# Patient Record
Sex: Female | Born: 1945 | Race: White | Hispanic: No | Smoking: Never smoker
Health system: Southern US, Community
[De-identification: ages and names within clinical notes are randomized; demographics above are authoritative.]

## PROBLEM LIST (undated history)

## (undated) DIAGNOSIS — E039 Hypothyroidism, unspecified: Secondary | ICD-10-CM

## (undated) DIAGNOSIS — Z8619 Personal history of other infectious and parasitic diseases: Secondary | ICD-10-CM

## (undated) DIAGNOSIS — F419 Anxiety disorder, unspecified: Secondary | ICD-10-CM

## (undated) DIAGNOSIS — G4700 Insomnia, unspecified: Secondary | ICD-10-CM

## (undated) DIAGNOSIS — G43909 Migraine, unspecified, not intractable, without status migrainosus: Secondary | ICD-10-CM

## (undated) DIAGNOSIS — E119 Type 2 diabetes mellitus without complications: Secondary | ICD-10-CM

## (undated) DIAGNOSIS — I10 Essential (primary) hypertension: Secondary | ICD-10-CM

## (undated) DIAGNOSIS — A809 Acute poliomyelitis, unspecified: Secondary | ICD-10-CM

## (undated) DIAGNOSIS — F32A Depression, unspecified: Secondary | ICD-10-CM

## (undated) DIAGNOSIS — N189 Chronic kidney disease, unspecified: Secondary | ICD-10-CM

## (undated) DIAGNOSIS — F329 Major depressive disorder, single episode, unspecified: Secondary | ICD-10-CM

## (undated) DIAGNOSIS — C73 Malignant neoplasm of thyroid gland: Secondary | ICD-10-CM

## (undated) DIAGNOSIS — E785 Hyperlipidemia, unspecified: Secondary | ICD-10-CM

## (undated) DIAGNOSIS — L719 Rosacea, unspecified: Secondary | ICD-10-CM

## (undated) DIAGNOSIS — Z87442 Personal history of urinary calculi: Secondary | ICD-10-CM

## (undated) HISTORY — DX: Acute poliomyelitis, unspecified: A80.9

## (undated) HISTORY — PX: BREAST SURGERY: SHX581

## (undated) HISTORY — DX: Personal history of other infectious and parasitic diseases: Z86.19

## (undated) HISTORY — DX: Chronic kidney disease, unspecified: N18.9

## (undated) HISTORY — DX: Insomnia, unspecified: G47.00

## (undated) HISTORY — DX: Hypothyroidism, unspecified: E03.9

## (undated) HISTORY — DX: Type 2 diabetes mellitus without complications: E11.9

## (undated) HISTORY — DX: Essential (primary) hypertension: I10

## (undated) HISTORY — DX: Hyperlipidemia, unspecified: E78.5

## (undated) HISTORY — DX: Personal history of urinary calculi: Z87.442

## (undated) HISTORY — DX: Anxiety disorder, unspecified: F41.9

## (undated) HISTORY — DX: Depression, unspecified: F32.A

## (undated) HISTORY — PX: APPENDECTOMY: SHX54

## (undated) HISTORY — DX: Rosacea, unspecified: L71.9

## (undated) HISTORY — DX: Migraine, unspecified, not intractable, without status migrainosus: G43.909

## (undated) HISTORY — PX: TUBAL LIGATION: SHX77

---
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
## 1995-09-30 HISTORY — PX: CHOLECYSTECTOMY: SHX55

## 2010-11-26 DIAGNOSIS — C50919 Malignant neoplasm of unspecified site of unspecified female breast: Secondary | ICD-10-CM

## 2010-11-26 HISTORY — DX: Malignant neoplasm of unspecified site of unspecified female breast: C50.919

## 2011-04-28 DIAGNOSIS — C73 Malignant neoplasm of thyroid gland: Secondary | ICD-10-CM

## 2011-04-28 HISTORY — DX: Malignant neoplasm of thyroid gland: C73

## 2011-05-05 HISTORY — PX: TOTAL THYROIDECTOMY: SHX2547

## 2011-05-29 ENCOUNTER — Other Ambulatory Visit (HOSPITAL_COMMUNITY): Payer: Self-pay | Admitting: Internal Medicine

## 2011-05-29 DIAGNOSIS — C73 Malignant neoplasm of thyroid gland: Secondary | ICD-10-CM

## 2011-06-03 ENCOUNTER — Encounter (HOSPITAL_COMMUNITY)
Admission: RE | Admit: 2011-06-03 | Discharge: 2011-06-03 | Disposition: A | Discharge status: Home / Self Care | Payer: Medicare Other | Source: Ambulatory Visit | Attending: Internal Medicine | Admitting: Internal Medicine

## 2011-06-03 ENCOUNTER — Encounter (HOSPITAL_COMMUNITY): Payer: Self-pay

## 2011-06-03 DIAGNOSIS — C73 Malignant neoplasm of thyroid gland: Secondary | ICD-10-CM | POA: Insufficient documentation

## 2011-06-03 HISTORY — DX: Malignant neoplasm of thyroid gland: C73

## 2011-06-03 MED ORDER — SODIUM IODIDE I 131 CAPSULE
108.0000 | Freq: Once | INTRAVENOUS | Status: AC | PRN
  Administered 2011-06-03: 108 via ORAL

## 2011-06-12 ENCOUNTER — Encounter (HOSPITAL_COMMUNITY)
Admission: RE | Admit: 2011-06-12 | Discharge: 2011-06-12 | Disposition: A | Discharge status: Home / Self Care | Payer: Medicare Other | Source: Ambulatory Visit | Attending: Internal Medicine | Admitting: Internal Medicine

## 2011-06-12 ENCOUNTER — Encounter (HOSPITAL_COMMUNITY): Payer: Self-pay

## 2011-06-12 DIAGNOSIS — C73 Malignant neoplasm of thyroid gland: Secondary | ICD-10-CM | POA: Insufficient documentation

## 2012-07-21 ENCOUNTER — Other Ambulatory Visit (HOSPITAL_COMMUNITY): Payer: Self-pay | Admitting: Internal Medicine

## 2012-07-21 DIAGNOSIS — C73 Malignant neoplasm of thyroid gland: Secondary | ICD-10-CM

## 2012-08-01 ENCOUNTER — Encounter (HOSPITAL_COMMUNITY): Payer: Medicare Other

## 2012-08-02 ENCOUNTER — Ambulatory Visit (HOSPITAL_COMMUNITY): Payer: Medicare Other

## 2012-08-03 ENCOUNTER — Ambulatory Visit (HOSPITAL_COMMUNITY): Payer: Medicare Other

## 2012-08-05 ENCOUNTER — Encounter (HOSPITAL_COMMUNITY): Payer: Medicare Other

## 2012-09-05 ENCOUNTER — Encounter (HOSPITAL_COMMUNITY)
Admission: RE | Admit: 2012-09-05 | Discharge: 2012-09-05 | Disposition: A | Discharge status: Home / Self Care | Payer: Medicare Other | Source: Ambulatory Visit | Attending: Internal Medicine | Admitting: Internal Medicine

## 2012-09-05 DIAGNOSIS — C73 Malignant neoplasm of thyroid gland: Secondary | ICD-10-CM | POA: Insufficient documentation

## 2012-09-05 DIAGNOSIS — E0789 Other specified disorders of thyroid: Secondary | ICD-10-CM | POA: Insufficient documentation

## 2012-09-05 MED ORDER — THYROTROPIN ALFA 1.1 MG IM SOLR
0.9000 mg | INTRAMUSCULAR | Status: AC
  Administered 2012-09-05: 0.9 mg via INTRAMUSCULAR
  Filled 2012-09-05: qty 0.9

## 2012-09-06 ENCOUNTER — Encounter (HOSPITAL_COMMUNITY): Payer: Medicare Other

## 2012-09-06 MED ORDER — THYROTROPIN ALFA 1.1 MG IM SOLR
0.9000 mg | INTRAMUSCULAR | Status: AC
  Administered 2012-09-06: 0.9 mg via INTRAMUSCULAR
  Filled 2012-09-06: qty 0.9

## 2012-09-07 ENCOUNTER — Encounter (HOSPITAL_COMMUNITY)
Admission: RE | Admit: 2012-09-07 | Discharge: 2012-09-07 | Disposition: A | Discharge status: Home / Self Care | Payer: Medicare Other | Source: Ambulatory Visit | Attending: Internal Medicine | Admitting: Internal Medicine

## 2012-09-09 ENCOUNTER — Encounter (HOSPITAL_COMMUNITY)
Admission: RE | Admit: 2012-09-09 | Discharge: 2012-09-09 | Disposition: A | Discharge status: Home / Self Care | Payer: Medicare Other | Source: Ambulatory Visit | Attending: Internal Medicine | Admitting: Internal Medicine

## 2012-09-09 MED ORDER — SODIUM IODIDE I 131 CAPSULE
4.0000 | Freq: Once | INTRAVENOUS | Status: AC | PRN
  Administered 2012-09-09: 4 via ORAL

## 2015-01-28 ENCOUNTER — Other Ambulatory Visit: Payer: Self-pay

## 2015-07-22 DIAGNOSIS — E039 Hypothyroidism, unspecified: Secondary | ICD-10-CM | POA: Insufficient documentation

## 2015-07-22 DIAGNOSIS — C73 Malignant neoplasm of thyroid gland: Secondary | ICD-10-CM | POA: Insufficient documentation

## 2015-09-17 DIAGNOSIS — C50912 Malignant neoplasm of unspecified site of left female breast: Secondary | ICD-10-CM | POA: Insufficient documentation

## 2015-11-22 DIAGNOSIS — N183 Chronic kidney disease, stage 3 unspecified: Secondary | ICD-10-CM | POA: Insufficient documentation

## 2015-11-22 DIAGNOSIS — E119 Type 2 diabetes mellitus without complications: Secondary | ICD-10-CM | POA: Insufficient documentation

## 2016-11-03 DIAGNOSIS — F419 Anxiety disorder, unspecified: Secondary | ICD-10-CM | POA: Insufficient documentation

## 2016-11-03 DIAGNOSIS — F329 Major depressive disorder, single episode, unspecified: Secondary | ICD-10-CM | POA: Insufficient documentation

## 2017-08-27 HISTORY — PX: EXTRACORPOREAL SHOCK WAVE LITHOTRIPSY: SHX1557

## 2019-10-06 ENCOUNTER — Other Ambulatory Visit: Payer: Self-pay

## 2019-10-06 ENCOUNTER — Encounter: Payer: Self-pay | Admitting: Internal Medicine

## 2019-10-06 ENCOUNTER — Ambulatory Visit (INDEPENDENT_AMBULATORY_CARE_PROVIDER_SITE_OTHER): Payer: Medicare Other | Admitting: Internal Medicine

## 2019-10-06 VITALS — BP 125/73 | HR 78 | Temp 95.6°F | Resp 18 | Ht 63.5 in | Wt 177.0 lb

## 2019-10-06 DIAGNOSIS — I10 Essential (primary) hypertension: Secondary | ICD-10-CM

## 2019-10-06 DIAGNOSIS — F325 Major depressive disorder, single episode, in full remission: Secondary | ICD-10-CM

## 2019-10-06 DIAGNOSIS — E890 Postprocedural hypothyroidism: Secondary | ICD-10-CM | POA: Diagnosis not present

## 2019-10-06 DIAGNOSIS — R634 Abnormal weight loss: Secondary | ICD-10-CM

## 2019-10-06 DIAGNOSIS — R519 Headache, unspecified: Secondary | ICD-10-CM | POA: Insufficient documentation

## 2019-10-06 DIAGNOSIS — E119 Type 2 diabetes mellitus without complications: Secondary | ICD-10-CM | POA: Diagnosis not present

## 2019-10-06 LAB — POCT GLUCOSE (DEVICE FOR HOME USE): POC Glucose: 161 mg/dl — AB (ref 70–99)

## 2019-10-06 MED ORDER — ESCITALOPRAM OXALATE 5 MG PO TABS: 5.0000 mg | ORAL_TABLET | Freq: Every day | ORAL | 2 refills | Status: DC

## 2019-10-06 NOTE — Progress Notes (Signed)
Pre visit review using our clinic review tool, if applicable. No additional management support is needed unless otherwise documented below in the visit note. 

## 2019-10-06 NOTE — Progress Notes (Signed)
Subjective:    Patient ID: Shari Drake, female    DOB: July 01, 1945, 74 y.o.   MRN: [id number]  DOS:  10/06/2019 Type of visit - description: New patient, previous doctor is retiring. We talk about all of her chronic medical problems  She reports a 1 year history of gradual weight loss, a total of 13 pounds. Also reports low blood sugars at least 2 times a month, in the 80s, associated with shaking.  History of depression, due to a number of factors she has not been feeling well emotionally think she needs a medication.  Review of Systems Denies fever chills No night sweats No unusual headaches No chest pain, difficulty breathing or palpitations No nausea, vomiting, diarrhea.  No blood in the stools. No cough No suicidal ideas.   Past Medical History:  Diagnosis Date  . Anxiety   . Breast cancer ([place]) 11/2010  . Chronic kidney disease   . Depression   . Diabetes ([place])   . History of chicken pox   . History of kidney stones   . Hyperlipidemia   . Hypertension   . Hypothyroidism   . Insomnia   . Migraine   . Polio    at 35 months old  . Rosacea   . Thyroid cancer ([place]) 2013   Family History  Problem Relation Age of Onset  . Diabetes Brother   . Heart disease Mother   . Hypertension Mother   . Heart disease Father 41       Acute MI  . Cancer Son   . Heart disease Maternal Grandmother   . Heart disease Maternal Grandfather   . Heart disease Paternal Grandmother   . Heart disease Paternal Grandfather   . Cancer Brother   . Colon cancer Neg Hx   . Breast cancer Neg Hx     Allergies as of 10/06/2019      Reactions   Iohexol    Wellbutrin [bupropion]    Iodinated Diagnostic Agents Nausea And Vomiting      Medication List       Accurate as of October 06, 2019 11:59 PM. If you have any questions, ask your nurse or doctor.        aspirin-acetaminophen-caffeine 250-250-65 MG tablet Commonly known as: EXCEDRIN MIGRAINE Take by mouth every 6 (six) hours as  needed for headache.   escitalopram 5 MG tablet Commonly known as: LEXAPRO Take 1 tablet (5 mg total) by mouth daily. Started by: Kathlene November, MD   glimepiride 2 MG tablet Commonly known as: AMARYL Take 0.5 tablets by mouth daily.   Melatonin 200 MCG Tabs Take 200 mcg by mouth daily.   metFORMIN 500 MG 24 hr tablet Commonly known as: GLUCOPHAGE-XR Take 1 tablet by mouth 2 (two) times daily with a meal.   metoprolol tartrate 25 MG tablet Commonly known as: LOPRESSOR Take 25 mg by mouth 2 (two) times daily.   Synthroid 150 MCG tablet Generic drug: levothyroxine Take 150 mcg by mouth daily.          Objective:   Physical Exam BP 125/73 (BP Location: Left Arm, Patient Position: Sitting, Cuff Size: Small)   Pulse 78   Temp (!) 95.6 F (35.3 C) (Temporal)   Resp 18   Ht 5' 3.5" (1.613 m)   Wt 177 lb (80.3 kg)   SpO2 97%   BMI 30.86 kg/m  General:   Well developed, NAD but diaphoretic appearing, BMI noted.  HEENT:  Normocephalic . Face symmetric,  atraumatic Neck: Had a thyroidectomy, no mass felt on exam Lungs:  CTA B Normal respiratory effort, no intercostal retractions, no accessory muscle use. Heart: RRR,  no murmur.  Abdomen:  Not distended, soft, non-tender. No rebound or rigidity.   Skin: Not pale. Not jaundice Lower extremities: no pretibial edema bilaterally  Neurologic:  alert & oriented X3.  Speech normal, gait appropriate for age and unassisted Psych--  Cognition and judgment appear intact.  Cooperative with normal attention span and concentration.  Behavior appropriate. No anxious or depressed appearing.     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  Check A1c. She is slightly diaphoretic today, reports she is simply a little warm, CBG 161. Hypothyroidism: On meds, check a TSH HTN: On metoprolol, BP today is very good, check a BMP CBC. Depression: On and off history of depression, last year has been very difficult due to the quarantine, the political situation (lost a friendship due to that) and isolation.  Denies suicidal ideas. Recommend counseling as a good alternative but she is not ready for that, does not think it will work. She thinks she needs a medication, in the past she tried 3 different SSRIs.  We agreed on a low-dose of Lexapro 5 mg daily (Lexapro was listed in her allergies but she denies having any problem) reassess in 2 months Weight loss: Per her only scale 13 pounds in a year, reassess on RTC, general labs today. RTC 2 months  This visit occurred during the SARS-CoV-2 public health emergency.  Safety protocols were in place, including screening questions prior to the visit, additional usage of staff PPE, and extensive cleaning of exam room while observing appropriate contact time as indicated for disinfecting solutions.

## 2019-10-06 NOTE — Patient Instructions (Addendum)
Please schedule Medicare Wellness with Glenard Haring.   Per our records you are due for an eye exam. Please contact your eye doctor to schedule an appointment. Please have them send copies of your office visit notes to [place]. Our fax number is [phone].  Decrease glimepiride to half tablet a day  Start escitalopram 5 mg: 1 tablet daily.  Watch for side effects.  Continue checking your blood sugars. If you continue to have low numbers in the 80s, let me know     GO TO THE LAB : Get the blood work     [place], [place] back for   a checkup in 2 months

## 2019-10-07 LAB — COMPREHENSIVE METABOLIC PANEL
AG Ratio: 1.7 (calc) (ref 1.0–2.5)
ALT: 16 U/L (ref 6–29)
AST: 16 U/L (ref 10–35)
Albumin: 4.4 g/dL (ref 3.6–5.1)
Alkaline phosphatase (APISO): 87 U/L (ref 37–153)
BUN: 18 mg/dL (ref 7–25)
CO2: 26 mmol/L (ref 20–32)
Calcium: 10.5 mg/dL — ABNORMAL HIGH (ref 8.6–10.4)
Chloride: 103 mmol/L (ref 98–110)
Creat: 0.84 mg/dL (ref 0.60–0.93)
Globulin: 2.6 g/dL (calc) (ref 1.9–3.7)
Glucose, Bld: 156 mg/dL — ABNORMAL HIGH (ref 65–99)
Potassium: 4.6 mmol/L (ref 3.5–5.3)
Sodium: 140 mmol/L (ref 135–146)
Total Bilirubin: 0.6 mg/dL (ref 0.2–1.2)
Total Protein: 7 g/dL (ref 6.1–8.1)

## 2019-10-07 LAB — CBC WITH DIFFERENTIAL/PLATELET
Absolute Monocytes: 622 cells/uL (ref 200–950)
Basophils Absolute: 52 cells/uL (ref 0–200)
Basophils Relative: 0.7 %
Eosinophils Absolute: 148 cells/uL (ref 15–500)
Eosinophils Relative: 2 %
HCT: 46.3 % — ABNORMAL HIGH (ref 35.0–45.0)
Hemoglobin: 15.8 g/dL — ABNORMAL HIGH (ref 11.7–15.5)
Lymphs Abs: 2139 cells/uL (ref 850–3900)
MCH: 29.9 pg (ref 27.0–33.0)
MCHC: 34.1 g/dL (ref 32.0–36.0)
MCV: 87.5 fL (ref 80.0–100.0)
MPV: 10.3 fL (ref 7.5–12.5)
Monocytes Relative: 8.4 %
Neutro Abs: 4440 cells/uL (ref 1500–7800)
Neutrophils Relative %: 60 %
Platelets: 343 10*3/uL (ref 140–400)
RBC: 5.29 10*6/uL — ABNORMAL HIGH (ref 3.80–5.10)
RDW: 12.7 % (ref 11.0–15.0)
Total Lymphocyte: 28.9 %
WBC: 7.4 10*3/uL (ref 3.8–10.8)

## 2019-10-07 LAB — TSH: TSH: 0.27 mIU/L — ABNORMAL LOW (ref 0.40–4.50)

## 2019-10-07 LAB — HEMOGLOBIN A1C
Hgb A1c MFr Bld: 5.9 % of total Hgb — ABNORMAL HIGH (ref <5.7)
Mean Plasma Glucose: 123 (calc)
eAG (mmol/L): 6.8 (calc)

## 2019-10-09 ENCOUNTER — Telehealth: Payer: Self-pay | Admitting: Internal Medicine

## 2019-10-09 MED ORDER — LEVOTHYROXINE SODIUM 125 MCG PO TABS: 125.0000 ug | ORAL_TABLET | Freq: Every day | ORAL | 0 refills | Status: DC

## 2019-10-09 NOTE — Addendum Note (Signed)
Addended byDamita Dunnings D on: 10/09/2019 07:52 AM   Modules accepted: Orders

## 2019-10-09 NOTE — Telephone Encounter (Signed)
Results faxed to Dr. Posey Pronto- instructed his office that Pt would like to discuss results.

## 2019-10-09 NOTE — Telephone Encounter (Signed)
Caller Flonnie Wierman  Call Back # [phone]  Patient states pharmacy called her states that her thyroid medication has changed . Patient states that Dr.Patel handles her thyroid medication. Patient would like [facility] to consult with Dr Posey Pronto to before changing medication [phone])   Please Advise

## 2019-10-09 NOTE — Telephone Encounter (Signed)
Please fax results to Dr. Posey Pronto. Advise patient to call Dr. Posey Pronto office and discussed results with them.

## 2019-10-13 ENCOUNTER — Telehealth: Payer: Self-pay | Admitting: Internal Medicine

## 2019-10-13 NOTE — Telephone Encounter (Signed)
Caller:Nitza Call back phone number: [phone]   Patient wants to know lab results, also if Dr. Larose Kells has consulted with Dr. Posey Pronto.  Please advise.

## 2019-10-13 NOTE — Telephone Encounter (Signed)
Spoke w/ Pt- informed of results and informed that I faxed TSH results to Dr. Posey Pronto and asked his office to call her regarding those. She hasn't received a call but will contact him. Requesting results to be mailed to her home address---will do.

## 2019-11-01 ENCOUNTER — Other Ambulatory Visit: Payer: Self-pay

## 2019-11-01 MED ORDER — ONETOUCH DELICA LANCETS 33G MISC: 12 refills | Status: DC

## 2019-11-10 ENCOUNTER — Encounter: Payer: Self-pay | Admitting: Internal Medicine

## 2019-11-16 ENCOUNTER — Other Ambulatory Visit: Payer: Self-pay

## 2019-11-16 MED ORDER — METOPROLOL TARTRATE 25 MG PO TABS: 25.0000 mg | ORAL_TABLET | Freq: Two times a day (BID) | ORAL | 1 refills | Status: DC

## 2019-12-08 ENCOUNTER — Other Ambulatory Visit: Payer: Self-pay

## 2019-12-08 ENCOUNTER — Ambulatory Visit (INDEPENDENT_AMBULATORY_CARE_PROVIDER_SITE_OTHER): Payer: Medicare Other | Admitting: Internal Medicine

## 2019-12-08 VITALS — BP 123/78 | HR 60 | Temp 98.3°F | Resp 12 | Ht 64.0 in | Wt 177.0 lb

## 2019-12-08 DIAGNOSIS — F419 Anxiety disorder, unspecified: Secondary | ICD-10-CM

## 2019-12-08 DIAGNOSIS — E119 Type 2 diabetes mellitus without complications: Secondary | ICD-10-CM | POA: Diagnosis not present

## 2019-12-08 DIAGNOSIS — F329 Major depressive disorder, single episode, unspecified: Secondary | ICD-10-CM

## 2019-12-08 DIAGNOSIS — E039 Hypothyroidism, unspecified: Secondary | ICD-10-CM

## 2019-12-08 MED ORDER — LEVOTHYROXINE SODIUM 125 MCG PO TABS: 125.0000 ug | ORAL_TABLET | Freq: Every day | ORAL | 0 refills | Status: DC

## 2019-12-08 MED ORDER — ESCITALOPRAM OXALATE 10 MG PO TABS: 10.0000 mg | ORAL_TABLET | Freq: Every day | ORAL | 3 refills | Status: DC

## 2019-12-08 NOTE — Patient Instructions (Addendum)
STOP glimepiride  Continue Metformin  Switch to the new lower dose of Synthroid  Increase escitalopram to 10 mg daily, I am sending a prescription  Continue checking your blood sugars, if they are more than 180 please let me know   GO TO THE FRONT DESK, PLEASE SCHEDULE YOUR APPOINTMENTS Come back for blood work only in 6 weeks      Come back for  a physical exam in 3 months

## 2019-12-08 NOTE — Progress Notes (Signed)
Subjective:    Patient ID: Shari Drake, female    DOB: Sep 04, 1945, 74 y.o.   MRN: [id number]  DOS:  12/08/2019 Type of visit - description: Follow-up Since the last office visit she is feeling great. She thinks that decreasing glimepiride dose may help for better Also assessed today: Thyroid disease, depression and anxiety.  Wt Readings from Last 3 Encounters:  12/08/19 177 lb (80.3 kg)  10/06/19 177 lb (80.3 kg)     Review of Systems Feels better emotionally although has  some anxiety.  No suicidal ideas  Past Medical History:  Diagnosis Date  . Anxiety   . Breast cancer ([place]) 11/2010  . Chronic kidney disease   . Depression   . Diabetes ([place])   . History of chicken pox   . History of kidney stones   . Hyperlipidemia   . Hypertension   . Hypothyroidism   . Insomnia   . Migraine   . Polio    at 75 months old  . Rosacea   . Thyroid cancer ([place]) 2013    Past Surgical History:  Procedure Laterality Date  . APPENDECTOMY    . BREAST SURGERY     masectomy- L and [place] SECTION     1970, 1973  . CHOLECYSTECTOMY  09/30/1995  . EXTRACORPOREAL SHOCK WAVE LITHOTRIPSY  08/27/2017  . TOTAL THYROIDECTOMY  05/05/2011  . TUBAL LIGATION      Allergies as of 12/08/2019      Reactions   Iohexol    Wellbutrin [bupropion]    Iodinated Diagnostic Agents Nausea And Vomiting      Medication List       Accurate as of December 08, 2019 11:59 PM. If you have any questions, ask your nurse or doctor.        STOP taking these medications   glimepiride 2 MG tablet Commonly known as: AMARYL Stopped by: Kathlene November, MD     TAKE these medications   aspirin-acetaminophen-caffeine [phone] MG tablet Commonly known as: EXCEDRIN MIGRAINE Take by mouth every 6 (six) hours as needed for headache.   escitalopram 10 MG tablet Commonly known as: LEXAPRO Take 1 tablet (10 mg total) by mouth daily. What changed:   medication strength  how much to take Changed by: Kathlene November, MD   levothyroxine 125 MCG tablet Commonly known as: SYNTHROID Take 1 tablet (125 mcg total) by mouth daily before breakfast.   Melatonin 200 MCG Tabs Take 200 mcg by mouth daily.   metFORMIN 500 MG 24 hr tablet Commonly known as: GLUCOPHAGE-XR Take 1 tablet by mouth 2 (two) times daily with a meal.   metoprolol tartrate 25 MG tablet Commonly known as: LOPRESSOR Take 1 tablet (25 mg total) by mouth 2 (two) times daily.   OneTouch Delica Lancets 93Y Misc Check blood sugars twice daily          Objective:   Physical Exam BP 123/78 (BP Location: Right Arm, Patient Position: Sitting, Cuff Size: Normal)   Pulse 60   Temp 98.3 F (36.8 C) (Oral)   Resp 12   Ht 5\' 4"  (1.626 m)   Wt 177 lb (80.3 kg)   SpO2 97%   BMI 30.38 kg/m  General:   Well developed, NAD, BMI noted. HEENT:  Normocephalic . Face symmetric, atraumatic Lower extremities: no pretibial edema bilaterally  Skin: Not pale. Not jaundice Neurologic:  alert & oriented X3.  Speech normal, gait appropriate for age and unassisted Psych--  Cognition  and judgment appear intact.  Cooperative with normal attention span and concentration.  Behavior appropriate. No anxious or depressed appearing.      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  That seems to make her feel a lot better. Plan: Stop glimepiride completely, continue Metformin, watch CBGs.  Current CBGs 120 in the morning, 140 in the afternoon.  If they get to be more than 180 she will let me know,  ?increase Metformin dose. Hypothyroidism: TSH was suppressed, has not yet switch from levothyroxine 150 mcg to 125 mcg but will do today.  Check a TSH in 6 weeks. Depression,  anxiety: Today she reports that in addition to depression she is also slightly anxious due to some family issues.  She thinks Lexapro 5 mg is doing very well but would like to increase the dose.  Increase to 10 mg reassess on RTC RTC labs 6 weeks RTC CPX 3 months  This visit occurred during the SARS-CoV-2 public health emergency.  Safety protocols were in place, including screening questions prior to the visit, additional usage of staff PPE, and extensive cleaning of exam room while observing appropriate contact time as indicated for disinfecting solutions.

## 2019-12-10 ENCOUNTER — Encounter: Payer: Self-pay | Admitting: Internal Medicine

## 2019-12-10 DIAGNOSIS — Z09 Encounter for follow-up examination after completed treatment for conditions other than malignant neoplasm: Secondary | ICD-10-CM | POA: Insufficient documentation

## 2019-12-10 NOTE — Assessment & Plan Note (Signed)
DM: Since her last office visit, glimepiride was decreased to half tablet due to A1c of 5.9.  That seems to make her feel a lot better. Plan: Stop glimepiride completely, continue Metformin, watch CBGs.  Current CBGs 120 in the morning, 140 in the afternoon.  If they get to be more than 180 she will let me know,  ?increase Metformin dose. Hypothyroidism: TSH was suppressed, has not yet switch from levothyroxine 150 mcg to 125 mcg but will do today.  Check a TSH in 6 weeks. Depression, anxiety: Today she reports that in addition to depression she is also slightly anxious due to some family issues.  She thinks Lexapro 5 mg is doing very well but would like to increase the dose.  Increase to 10 mg reassess on RTC RTC labs 6 weeks RTC CPX 3 months

## 2019-12-31 ENCOUNTER — Other Ambulatory Visit: Payer: Self-pay | Admitting: Internal Medicine

## 2020-01-02 MED ORDER — ESCITALOPRAM OXALATE 10 MG PO TABS: 10.0000 mg | ORAL_TABLET | Freq: Every day | ORAL | 3 refills | Status: DC

## 2020-01-18 ENCOUNTER — Other Ambulatory Visit: Payer: Medicare Other

## 2020-02-08 ENCOUNTER — Other Ambulatory Visit: Payer: Self-pay

## 2020-02-08 MED ORDER — METFORMIN HCL ER 500 MG PO TB24: 500.0000 mg | ORAL_TABLET | Freq: Two times a day (BID) | ORAL | 0 refills | Status: DC

## 2020-02-12 ENCOUNTER — Other Ambulatory Visit: Payer: Self-pay | Admitting: Internal Medicine

## 2020-03-02 ENCOUNTER — Other Ambulatory Visit: Payer: Self-pay | Admitting: Internal Medicine

## 2020-03-08 ENCOUNTER — Encounter: Payer: Self-pay | Admitting: Internal Medicine

## 2020-03-15 ENCOUNTER — Ambulatory Visit: Payer: Medicare Other | Admitting: Internal Medicine

## 2020-03-29 ENCOUNTER — Other Ambulatory Visit: Payer: Self-pay

## 2020-03-29 ENCOUNTER — Ambulatory Visit (INDEPENDENT_AMBULATORY_CARE_PROVIDER_SITE_OTHER): Payer: Medicare Other | Admitting: Internal Medicine

## 2020-03-29 VITALS — BP 154/70 | HR 58 | Temp 97.7°F | Ht 64.0 in | Wt 174.8 lb

## 2020-03-29 DIAGNOSIS — E039 Hypothyroidism, unspecified: Secondary | ICD-10-CM | POA: Diagnosis not present

## 2020-03-29 DIAGNOSIS — E119 Type 2 diabetes mellitus without complications: Secondary | ICD-10-CM

## 2020-03-29 DIAGNOSIS — Z7185 Encounter for immunization safety counseling: Secondary | ICD-10-CM | POA: Diagnosis not present

## 2020-03-29 DIAGNOSIS — Z23 Encounter for immunization: Secondary | ICD-10-CM

## 2020-03-29 NOTE — Progress Notes (Signed)
Subjective:    Patient ID: Shari Drake, female    DOB: 1946-01-18, 74 y.o.   MRN: [id number]  DOS:  03/29/2020 Type of visit - description: Follow-up Since the last office visit she is actually feeling great. Her diet is healthy, she is trying to walk every other day. Emotionally is doing great.   Review of Systems See above   Past Medical History:  Diagnosis Date  . Anxiety   . Breast cancer ([place]) 11/2010  . Chronic kidney disease   . Depression   . Diabetes ([place])   . History of chicken pox   . History of kidney stones   . Hyperlipidemia   . Hypertension   . Hypothyroidism   . Insomnia   . Migraine   . Polio    at 54 months old  . Rosacea   . Thyroid cancer ([place]) 2013    Past Surgical History:  Procedure Laterality Date  . APPENDECTOMY    . BREAST SURGERY     masectomy- L and [place] SECTION     1970, 1973  . CHOLECYSTECTOMY  09/30/1995  . EXTRACORPOREAL SHOCK WAVE LITHOTRIPSY  08/27/2017  . TOTAL THYROIDECTOMY  05/05/2011  . TUBAL LIGATION      Allergies as of 03/29/2020      Reactions   Iohexol    Wellbutrin [bupropion]    Iodinated Diagnostic Agents Nausea And Vomiting      Medication List       Accurate as of March 29, 2020 11:59 PM. If you have any questions, ask your nurse or doctor.        aspirin-acetaminophen-caffeine 250-250-65 MG tablet Commonly known as: EXCEDRIN MIGRAINE Take by mouth every 6 (six) hours as needed for headache.   escitalopram 10 MG tablet Commonly known as: LEXAPRO Take 1 tablet (10 mg total) by mouth daily.   levothyroxine 125 MCG tablet Commonly known as: Synthroid Take 1 tablet (125 mcg total) by mouth daily before breakfast.   Melatonin 200 MCG Tabs Take 200 mcg by mouth daily.   metFORMIN 500 MG 24 hr tablet Commonly known as: GLUCOPHAGE-XR Take 1 tablet (500 mg total) by mouth 2 (two) times daily with a meal.   metoprolol tartrate 25 MG tablet Commonly known as: LOPRESSOR Take 1 tablet (25  mg total) by mouth 2 (two) times daily.   OneTouch Delica Lancets 91Y Misc Check blood sugars twice daily          Objective:   Physical Exam BP (!) 154/70 (BP Location: Right Arm, Patient Position: Sitting, Cuff Size: Large)   Pulse (!) 58   Temp 97.7 F (36.5 C) (Oral)   Ht 5\' 4"  (1.626 m)   Wt 174 lb 12.8 oz (79.3 kg)   SpO2 99%   BMI 30.00 kg/m  General:   Well developed, NAD, BMI noted. HEENT:  Normocephalic . Face symmetric, atraumatic Lungs:  CTA B Normal respiratory effort, no intercostal retractions, no accessory muscle use. Heart: RRR,  no murmur.  Lower extremities: no pretibial edema bilaterally  Skin: Not pale. Not jaundice Neurologic:  alert & oriented X3.  Speech normal, gait appropriate for age and unassisted Psych--  Cognition and judgment appear intact.  Cooperative with normal attention span and concentration.  Behavior appropriate. No anxious or depressed appearing.      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  In the afternoon 150, 160.  Check A1c, CMP, consider adjust Metformin if needed HTN: On metoprolol, BP is very good. Depression, anxiety: We increase Lexapro the last time, doing great.  RF as needed Hypothyroidism: Taking Synthroid 125 mcg, check a TSH. Preventive care reviewed, to have a CPX at the next opportunity: Tdap recommended PNM 13:  2019 PMN 23: 2012 Had COVID vaccine x3 Flu shot toda CCS: Never had a colonoscopy, did have stool tests before (-)  female care: Has not seen a gynecologist in years RTC 4 months CPX   This visit occurred during the SARS-CoV-2 public health emergency.  Safety protocols were in place, including screening questions prior to the visit, additional usage of staff  PPE, and extensive cleaning of exam room while observing appropriate contact time as indicated for disinfecting solutions.

## 2020-03-29 NOTE — Patient Instructions (Addendum)
   When you are ready, please proceed with Tdap at your pharmacy, it is a tetanus shot that also contains protection against the whooping cough.  GO TO THE LAB : Get the blood work     GO TO THE FRONT DESK, [place] Come back for a physical exam in 4 months

## 2020-03-30 LAB — COMPREHENSIVE METABOLIC PANEL
AG Ratio: 1.8 (calc) (ref 1.0–2.5)
ALT: 11 U/L (ref 6–29)
AST: 17 U/L (ref 10–35)
Albumin: 4.1 g/dL (ref 3.6–5.1)
Alkaline phosphatase (APISO): 66 U/L (ref 37–153)
BUN: 17 mg/dL (ref 7–25)
CO2: 29 mmol/L (ref 20–32)
Calcium: 9.7 mg/dL (ref 8.6–10.4)
Chloride: 102 mmol/L (ref 98–110)
Creat: 0.78 mg/dL (ref 0.60–0.93)
Globulin: 2.3 g/dL (calc) (ref 1.9–3.7)
Glucose, Bld: 170 mg/dL — ABNORMAL HIGH (ref 65–99)
Potassium: 3.9 mmol/L (ref 3.5–5.3)
Sodium: 140 mmol/L (ref 135–146)
Total Bilirubin: 0.7 mg/dL (ref 0.2–1.2)
Total Protein: 6.4 g/dL (ref 6.1–8.1)

## 2020-03-30 LAB — LIPID PANEL
Cholesterol: 128 mg/dL (ref <200)
HDL: 37 mg/dL — ABNORMAL LOW (ref >=50)
LDL Cholesterol (Calc): 65 mg/dL (calc)
Non-HDL Cholesterol (Calc): 91 mg/dL (calc) (ref <130)
Total CHOL/HDL Ratio: 3.5 (calc) (ref <5.0)
Triglycerides: 182 mg/dL — ABNORMAL HIGH (ref <150)

## 2020-03-30 LAB — HEMOGLOBIN A1C
Hgb A1c MFr Bld: 6.6 % of total Hgb — ABNORMAL HIGH (ref <5.7)
Mean Plasma Glucose: 143 (calc)
eAG (mmol/L): 7.9 (calc)

## 2020-03-30 LAB — TSH: TSH: 1.2 mIU/L (ref 0.40–4.50)

## 2020-03-31 NOTE — Assessment & Plan Note (Signed)
DM: On Metformin, CBGs in the morning 130, 150.  In the afternoon 150, 160.  Check A1c, CMP, consider adjust Metformin if needed HTN: On metoprolol, BP is very good. Depression, anxiety: We increase Lexapro the last time, doing great.  RF as needed Hypothyroidism: Taking Synthroid 125 mcg, check a TSH. Preventive care reviewed, to have a CPX at the next opportunity: Tdap recommended PNM 13:  2019 PMN 23: 2012 Had COVID vaccine x3 Flu shot toda CCS: Never had a colonoscopy, did have stool tests before (-)  female care: Has not seen a gynecologist in years RTC 4 months CPX

## 2020-04-08 ENCOUNTER — Telehealth: Payer: Self-pay | Admitting: Internal Medicine

## 2020-04-08 NOTE — Telephone Encounter (Signed)
Results mailed 

## 2020-04-08 NOTE — Telephone Encounter (Signed)
Patient is requesting lab results sent to home address.

## 2020-05-01 ENCOUNTER — Other Ambulatory Visit: Payer: Self-pay | Admitting: Internal Medicine

## 2020-06-11 ENCOUNTER — Encounter: Payer: Self-pay | Admitting: Internal Medicine

## 2020-06-11 ENCOUNTER — Telehealth (INDEPENDENT_AMBULATORY_CARE_PROVIDER_SITE_OTHER): Payer: Medicare Other | Admitting: Internal Medicine

## 2020-06-11 ENCOUNTER — Other Ambulatory Visit: Payer: Self-pay

## 2020-06-11 VITALS — HR 60 | Temp 98.2°F | Ht 64.0 in | Wt 171.0 lb

## 2020-06-11 DIAGNOSIS — N760 Acute vaginitis: Secondary | ICD-10-CM

## 2020-06-11 DIAGNOSIS — M545 Low back pain, unspecified: Secondary | ICD-10-CM | POA: Diagnosis not present

## 2020-06-11 MED ORDER — FLUCONAZOLE 150 MG PO TABS: 150.0000 mg | ORAL_TABLET | Freq: Every day | ORAL | 0 refills | Status: DC

## 2020-06-11 NOTE — Progress Notes (Signed)
Subjective:    Patient ID: Shari Drake, female    DOB: 07/31/1945, 75 y.o.   MRN: [id number]  DOS:  06/11/2020 Type of visit - description: Virtual Visit via Telephone    I connected with above mentioned patient  by telephone and verified that I am speaking with the correct person using two identifiers.  THIS ENCOUNTER IS A VIRTUAL VISIT DUE TO COVID-19 - PATIENT WAS NOT SEEN IN THE OFFICE. PATIENT HAS CONSENTED TO VIRTUAL VISIT / TELEMEDICINE VISIT   Location of patient: home  Location of provider: office  Persons participating in the virtual visit: patient, provider   I discussed the limitations, risks, security and privacy concerns of performing an evaluation and management service by telephone and the availability of in person appointments. I also discussed with the patient that there may be a patient responsible charge related to this service. The patient expressed understanding and agreed to proceed.  Acute Symptoms a started 3 weeks ago: Low back pain, feeling bloated all the time, vulvar itching and some vulvar  swelling. She felt all of the above symptoms were due to vaginitis, has used OTC creams x2 without much help.  Denies fever chills No vaginal discharge or bleeding No nausea, vomiting, heartburn. No constipation or diarrhea, BMs are normal No dysuria, gross hematuria or difficulty urinating     Review of Systems See above   Past Medical History:  Diagnosis Date  . Anxiety   . Breast cancer ([place]) 11/2010  . Chronic kidney disease   . Depression   . Diabetes ([place])   . History of chicken pox   . History of kidney stones   . Hyperlipidemia   . Hypertension   . Hypothyroidism   . Insomnia   . Migraine   . Polio    at 27 months old  . Rosacea   . Thyroid cancer ([place]) 2013    Past Surgical History:  Procedure Laterality Date  . APPENDECTOMY    . BREAST SURGERY     masectomy- L and [place] SECTION     1970, 1973  . CHOLECYSTECTOMY   09/30/1995  . EXTRACORPOREAL SHOCK WAVE LITHOTRIPSY  08/27/2017  . TOTAL THYROIDECTOMY  05/05/2011  . TUBAL LIGATION      Allergies as of 06/11/2020      Reactions   Iohexol    Wellbutrin [bupropion]    Iodinated Diagnostic Agents Nausea And Vomiting      Medication List       Accurate as of June 11, 2020  2:23 PM. If you have any questions, ask your nurse or doctor.        aspirin-acetaminophen-caffeine 250-250-65 MG tablet Commonly known as: EXCEDRIN MIGRAINE Take by mouth every 6 (six) hours as needed for headache.   escitalopram 10 MG tablet Commonly known as: LEXAPRO Take 1 tablet (10 mg total) by mouth daily.   levothyroxine 125 MCG tablet Commonly known as: Synthroid Take 1 tablet (125 mcg total) by mouth daily before breakfast.   Melatonin 200 MCG Tabs Take 200 mcg by mouth daily.   metFORMIN 500 MG 24 hr tablet Commonly known as: GLUCOPHAGE-XR Take 1 tablet (500 mg total) by mouth 2 (two) times daily with a meal.   metoprolol tartrate 25 MG tablet Commonly known as: LOPRESSOR Take 1 tablet (25 mg total) by mouth 2 (two) times daily.   OneTouch Delica Lancets 18H Misc Check blood sugars twice daily  Objective:   Physical Exam Pulse 60   Temp 98.2 F (36.8 C)   Ht 5\' 4"  (1.626 m)   Wt 171 lb (77.6 kg)   SpO2 96%   BMI 29.35 kg/m  This is a telephone visit, she sounded alert oriented x3, in no distress.    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 Back pain, feeling bloated: She believes these symptoms are due to vaginitis, advised patient I do not think so, we agreed to try Diflucan and if she is no better she will come back within the next  few days for further evaluation   I discussed the assessment and treatment plan with the patient. The patient was provided an opportunity to ask questions and all were answered. The patient agreed with the plan and demonstrated an understanding of the instructions.   The patient was advised to call back or seek an in-person evaluation if the symptoms worsen or if the condition fails to improve as anticipated.  I provided 22 minutes of non-face-to-face time during this encounter.  Kathlene November, MD

## 2020-06-12 NOTE — Assessment & Plan Note (Signed)
Vaginitis: Reports vulvar itching and mild swelling not responding to OTC ointments x2, trial with Diflucan. Back pain, feeling bloated: She believes these symptoms are due to vaginitis, advised patient I do not think so, we agreed to try Diflucan and if she is no better she will come back within the next few days for further evaluation

## 2020-06-29 ENCOUNTER — Other Ambulatory Visit: Payer: Self-pay | Admitting: Internal Medicine

## 2020-07-29 ENCOUNTER — Other Ambulatory Visit: Payer: Self-pay | Admitting: Internal Medicine

## 2020-08-02 ENCOUNTER — Ambulatory Visit (INDEPENDENT_AMBULATORY_CARE_PROVIDER_SITE_OTHER): Payer: Medicare Other | Admitting: Internal Medicine

## 2020-08-02 ENCOUNTER — Other Ambulatory Visit: Payer: Self-pay

## 2020-08-02 ENCOUNTER — Telehealth: Payer: Self-pay | Admitting: Hematology and Oncology

## 2020-08-02 ENCOUNTER — Encounter: Payer: Self-pay | Admitting: Internal Medicine

## 2020-08-02 VITALS — BP 160/81 | HR 62 | Temp 97.7°F | Ht 64.0 in | Wt 174.0 lb

## 2020-08-02 DIAGNOSIS — N183 Chronic kidney disease, stage 3 unspecified: Secondary | ICD-10-CM | POA: Diagnosis not present

## 2020-08-02 DIAGNOSIS — E119 Type 2 diabetes mellitus without complications: Secondary | ICD-10-CM

## 2020-08-02 DIAGNOSIS — Z23 Encounter for immunization: Secondary | ICD-10-CM

## 2020-08-02 DIAGNOSIS — Z1211 Encounter for screening for malignant neoplasm of colon: Secondary | ICD-10-CM

## 2020-08-02 DIAGNOSIS — Z0001 Encounter for general adult medical examination with abnormal findings: Secondary | ICD-10-CM

## 2020-08-02 DIAGNOSIS — Z0000 Encounter for general adult medical examination without abnormal findings: Secondary | ICD-10-CM

## 2020-08-02 DIAGNOSIS — E039 Hypothyroidism, unspecified: Secondary | ICD-10-CM

## 2020-08-02 DIAGNOSIS — Z853 Personal history of malignant neoplasm of breast: Secondary | ICD-10-CM

## 2020-08-02 DIAGNOSIS — I10 Essential (primary) hypertension: Secondary | ICD-10-CM

## 2020-08-02 DIAGNOSIS — R222 Localized swelling, mass and lump, trunk: Secondary | ICD-10-CM

## 2020-08-02 DIAGNOSIS — Z1159 Encounter for screening for other viral diseases: Secondary | ICD-10-CM

## 2020-08-02 DIAGNOSIS — Z780 Asymptomatic menopausal state: Secondary | ICD-10-CM | POA: Diagnosis not present

## 2020-08-02 MED ORDER — LOSARTAN POTASSIUM 50 MG PO TABS: 50.0000 mg | ORAL_TABLET | Freq: Every day | ORAL | 0 refills | Status: DC

## 2020-08-02 NOTE — Patient Instructions (Addendum)
Per our records you are due for an eye exam. Please contact your eye doctor to schedule an appointment. Please have them send copies of your office visit notes to [place]. Our fax number is [phone].  We are adding losartan to get better BP control, take one every night Check your blood pressure 3 times a week BP GOAL is between 110/65 and  135/85.   GO TO THE LAB : Get the blood work     [place], [place] back for blood work in 2 weeks  Come back for a checkup in 6 months  Stop by the first floor and schedule a bone density test  Consider getting a Tdap at your pharmacy     "Living will", "[place] of attorney": Advanced care planning  (If you already have a living will or healthcare power of attorney, please bring the copy to be scanned in your chart.)  Advance care planning is a process that supports adults in  understanding and sharing their preferences regarding future medical care.   The patient's preferences are recorded in documents called Advance Directives.    Advanced directives are completed (and can be modified at any time) while the patient is in full mental capacity.   The documentation should be available at all times to the patient, the family and the healthcare providers.  Bring in a copy to be scanned in your chart is an excellent idea and is recommended   This legal documents direct treatment decision making and/or appoint a surrogate to make the decision if the patient is not capable to do so.    Advance directives can be documented in many types of formats,  documents have names such as:  Lliving will  Durable power of attorney for healthcare (healthcare proxy or healthcare power of attorney)  Combined directives  Physician orders for life-sustaining treatment    More information at:  [email or web address]

## 2020-08-02 NOTE — Telephone Encounter (Signed)
Created in error

## 2020-08-02 NOTE — Progress Notes (Signed)
Subjective:    Patient ID: Shari Drake, female    DOB: [birth year], 75 y.o.   MRN: [id number]  DOS:  08/02/2020   Here for CPX  Since the last office visit she is doing well. Did noted a couple of places at the right mastectomy scar and she is extremely concerned about the issue given her history of cancer.    BP Readings from Last 3 Encounters:  08/02/20 (!) 160/81  03/29/20 (!) 154/70  12/08/19 123/78      Review of Systems  Other than above, a 14 point review of systems is negative      Past Medical History:  Diagnosis Date  . Anxiety   . Breast cancer ([place]) 11/2010  . Chronic kidney disease   . Depression   . Diabetes ([place])   . History of chicken pox   . History of kidney stones   . Hyperlipidemia   . Hypertension   . Hypothyroidism   . Insomnia   . Migraine   . Polio    at 33 months old  . Rosacea   . Thyroid cancer ([place]) 2013    Past Surgical History:  Procedure Laterality Date  . APPENDECTOMY    . BREAST SURGERY     masectomy- L and [place] SECTION     1970, 1973  . CHOLECYSTECTOMY  09/30/1995  . EXTRACORPOREAL SHOCK WAVE LITHOTRIPSY  08/27/2017  . TOTAL THYROIDECTOMY  05/05/2011  . TUBAL LIGATION      Allergies as of 08/02/2020      Reactions   Iohexol    Wellbutrin [bupropion]    Iodinated Diagnostic Agents Nausea And Vomiting      Medication List       Accurate as of August 02, 2020  2:07 PM. If you have any questions, ask your nurse or doctor.        aspirin-acetaminophen-caffeine 250-250-65 MG tablet Commonly known as: EXCEDRIN MIGRAINE Take by mouth every 6 (six) hours as needed for headache.   escitalopram 10 MG tablet Commonly known as: LEXAPRO Take 1 tablet (10 mg total) by mouth daily.   fluconazole 150 MG tablet Commonly known as: DIFLUCAN Take 1 tablet (150 mg total) by mouth daily.   levothyroxine 125 MCG tablet Commonly known as: Synthroid Take 1 tablet (125 mcg total) by mouth daily before breakfast.    Melatonin 200 MCG Tabs Take 200 mcg by mouth daily.   metFORMIN 500 MG 24 hr tablet Commonly known as: GLUCOPHAGE-XR Take 1 tablet (500 mg total) by mouth 2 (two) times daily with a meal.   metoprolol tartrate 25 MG tablet Commonly known as: LOPRESSOR Take 1 tablet (25 mg total) by mouth 2 (two) times daily.   OneTouch Delica Lancets 08M Misc Check blood sugars twice daily          Objective:   Physical Exam BP (!) 160/81 (BP Location: Right Arm, Patient Position: Sitting, Cuff Size: Large)   Pulse 62   Temp 97.7 F (36.5 C) (Temporal)   Ht 5\' 4"  (1.626 m)   Wt 174 lb (78.9 kg)   SpO2 98%   BMI 29.87 kg/m  General: Well developed, NAD, BMI noted Neck: History of total thyroidectomy, neck is free of mass or lymphadenopathies HEENT:  Normocephalic . Face symmetric, atraumatic Lungs:  CTA B Normal respiratory effort, no intercostal retractions, no accessory muscle use. Heart: RRR,  no murmur. Chest Wall: Status post mastectomies bilaterally. Left the scar is well-healed Right eschar:  It is well-healed, she point to a couple of small lumps, she does have 2 pellet-like lumps that are easily movable. I did feel a small lymph node at the right axillary area (mobile, not attached to deeper structures, less than 1 cm). Abdomen:  Not distended, soft, non-tender. No rebound or rigidity.   Lower extremities: no pretibial edema bilaterally  Skin: Exposed areas without rash. Not pale. Not jaundice Neurologic:  alert & oriented X3.  Speech normal, gait appropriate for age and unassisted Strength symmetric and appropriate for age.  Psych: Cognition and judgment appear intact.  Cooperative with normal attention span and concentration.  Behavior appropriate. No anxious or depressed appearing.     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 HTN: BP has been elevated twice, no ambulatory BPs.  Continue metoprolol, add losartan 50, BMP today and in 2 weeks.  Recommend to start checking BPs. History of thyroid cancer: Physical exam today is benign. History of breast cancer: See CPX Hypothyroidism: Checking labs RTC labs in 2 weeks RTC visit 6 months       In addition to CPX we address her chronic medical problems and a new problem (abnormal surgical scar on self palpation).   This visit occurred during the SARS-CoV-2 public health emergency.  Safety protocols were in place, including screening questions prior to the visit, additional usage of staff PPE, and extensive cleaning of exam room while observing appropriate contact time as indicated for disinfecting solutions.

## 2020-08-03 ENCOUNTER — Encounter: Payer: Self-pay | Admitting: Internal Medicine

## 2020-08-03 DIAGNOSIS — Z0000 Encounter for general adult medical examination without abnormal findings: Secondary | ICD-10-CM | POA: Insufficient documentation

## 2020-08-03 NOTE — Assessment & Plan Note (Signed)
Here for CPX DM: On Metformin, checking A1c. HTN: BP has been elevated twice, no ambulatory BPs.  Continue metoprolol, add losartan 50, BMP today and in 2 weeks.  Recommend to start checking BPs. History of thyroid cancer: Physical exam today is benign. History of breast cancer: See CPX Hypothyroidism: Checking labs RTC labs in 2 weeks RTC visit 6 months

## 2020-08-03 NOTE — Assessment & Plan Note (Signed)
Tdap: Again rec to proceed at the pharmacy [facility] 13:  2019; PMN 23: 2012, booster today shingrix recommend ($$) Had COVID vaccine x3 Female care: Cervical ca screening: never had an abnormal PAP, no sxs, we agreed no further eval H/o Breast CA, s/p B mastectomy.   Has noted a couple of lumps at the right eschar, exam confirms presence of two pellet-like lumps and a right axillary lymph node.  They do not seem to be malignant on clinical grounds but here she remains concerned, we agreed she will see surgery to confirm everything is okay. Dexa: last ~ 2016, no on Ca and Vit D, rec to start vit D, check DEXA CCS: Never had a colonoscopy, 3 options d/w pt: cologuard, rec to check coverage w/ insurance  Labs: BMP, CBC, A1c, TSH, vitamin D, hep C Advance care planning d/w pt

## 2020-08-05 ENCOUNTER — Telehealth: Payer: Self-pay | Admitting: Internal Medicine

## 2020-08-05 NOTE — Telephone Encounter (Signed)
Patient would like lab done on 08/02/20 mailed to her

## 2020-08-05 NOTE — Telephone Encounter (Signed)
Will do as soon as Dr. Larose Kells reviews them. -Jma

## 2020-08-06 LAB — CBC WITH DIFFERENTIAL/PLATELET
Absolute Monocytes: 599 cells/uL (ref 200–950)
Basophils Absolute: 52 cells/uL (ref 0–200)
Basophils Relative: 0.7 %
Eosinophils Absolute: 111 cells/uL (ref 15–500)
Eosinophils Relative: 1.5 %
HCT: 46.5 % — ABNORMAL HIGH (ref 35.0–45.0)
Hemoglobin: 15.9 g/dL — ABNORMAL HIGH (ref 11.7–15.5)
Lymphs Abs: 2361 cells/uL (ref 850–3900)
MCH: 29.8 pg (ref 27.0–33.0)
MCHC: 34.2 g/dL (ref 32.0–36.0)
MCV: 87.2 fL (ref 80.0–100.0)
MPV: 9.6 fL (ref 7.5–12.5)
Monocytes Relative: 8.1 %
Neutro Abs: 4277 cells/uL (ref 1500–7800)
Neutrophils Relative %: 57.8 %
Platelets: 358 10*3/uL (ref 140–400)
RBC: 5.33 10*6/uL — ABNORMAL HIGH (ref 3.80–5.10)
RDW: 13 % (ref 11.0–15.0)
Total Lymphocyte: 31.9 %
WBC: 7.4 10*3/uL (ref 3.8–10.8)

## 2020-08-06 LAB — VITAMIN D 25 HYDROXY (VIT D DEFICIENCY, FRACTURES): Vit D, 25-Hydroxy: 12 ng/mL — ABNORMAL LOW (ref 30–100)

## 2020-08-06 LAB — HEMOGLOBIN A1C
Hgb A1c MFr Bld: 6.5 % of total Hgb — ABNORMAL HIGH (ref <5.7)
Mean Plasma Glucose: 140 mg/dL
eAG (mmol/L): 7.7 mmol/L

## 2020-08-06 LAB — BASIC METABOLIC PANEL
BUN: 15 mg/dL (ref 7–25)
CO2: 24 mmol/L (ref 20–32)
Calcium: 10.1 mg/dL (ref 8.6–10.4)
Chloride: 104 mmol/L (ref 98–110)
Creat: 0.8 mg/dL (ref 0.60–0.93)
Glucose, Bld: 137 mg/dL — ABNORMAL HIGH (ref 65–99)
Potassium: 5.1 mmol/L (ref 3.5–5.3)
Sodium: 143 mmol/L (ref 135–146)

## 2020-08-06 LAB — HEPATITIS C ANTIBODY
Hepatitis C Ab: NONREACTIVE
SIGNAL TO CUT-OFF: 0.01 (ref <1.00)

## 2020-08-06 LAB — TSH: TSH: 1.53 mIU/L (ref 0.40–4.50)

## 2020-08-08 ENCOUNTER — Other Ambulatory Visit: Payer: Self-pay

## 2020-08-08 MED ORDER — VITAMIN D (ERGOCALCIFEROL) 1.25 MG (50000 UNIT) PO CAPS: 50000.0000 [IU] | ORAL_CAPSULE | ORAL | 0 refills | Status: DC

## 2020-08-08 NOTE — Telephone Encounter (Signed)
Results mailed 

## 2020-08-08 NOTE — Addendum Note (Signed)
Addended byDamita Dunnings D on: 08/08/2020 02:12 PM   Modules accepted: Orders

## 2020-08-14 LAB — COLOGUARD

## 2020-08-16 ENCOUNTER — Other Ambulatory Visit: Payer: Self-pay

## 2020-08-16 ENCOUNTER — Other Ambulatory Visit: Payer: Medicare Other

## 2020-08-16 MED ORDER — ONETOUCH VERIO VI STRP: ORAL_STRIP | 12 refills | Status: DC

## 2020-08-22 DIAGNOSIS — C50212 Malignant neoplasm of upper-inner quadrant of left female breast: Secondary | ICD-10-CM | POA: Diagnosis not present

## 2020-08-22 DIAGNOSIS — R222 Localized swelling, mass and lump, trunk: Secondary | ICD-10-CM | POA: Diagnosis not present

## 2020-08-22 DIAGNOSIS — Z9013 Acquired absence of bilateral breasts and nipples: Secondary | ICD-10-CM | POA: Diagnosis not present

## 2020-08-22 DIAGNOSIS — Z170 Estrogen receptor positive status [ER+]: Secondary | ICD-10-CM | POA: Diagnosis not present

## 2020-08-22 DIAGNOSIS — Z853 Personal history of malignant neoplasm of breast: Secondary | ICD-10-CM | POA: Diagnosis not present

## 2020-08-22 DIAGNOSIS — Z79811 Long term (current) use of aromatase inhibitors: Secondary | ICD-10-CM | POA: Diagnosis not present

## 2020-08-26 ENCOUNTER — Other Ambulatory Visit: Payer: Self-pay | Admitting: Internal Medicine

## 2020-09-22 ENCOUNTER — Other Ambulatory Visit: Payer: Self-pay | Admitting: Internal Medicine

## 2020-09-24 ENCOUNTER — Telehealth: Payer: Self-pay | Admitting: Internal Medicine

## 2020-09-24 NOTE — Telephone Encounter (Signed)
Please advise 

## 2020-09-24 NOTE — Telephone Encounter (Signed)
Pt states that Dr. Larose Kells informed her that if her blood sugar was over 200 at night to take two metformin. Pt states that the pharmacy informed her to let Dr. Larose Kells know ensure that he puts the directions on the rx that its ok to take 2 at night. Pt states she needs a refill now.

## 2020-09-24 NOTE — Telephone Encounter (Signed)
As far as I know, she takes metformin XR 500 mg twice daily.  Last A1c was excellent, recommend to continue with same dose.  Okay to send a refill. Continue checking CBGs, if they are consistently more than 200 watch diet and let me know.

## 2020-09-24 NOTE — Telephone Encounter (Signed)
Spoke w/ Pt- informed of recommendations. Pt verbalized understanding.  

## 2020-10-25 ENCOUNTER — Other Ambulatory Visit: Payer: Self-pay | Admitting: Internal Medicine

## 2020-11-24 ENCOUNTER — Other Ambulatory Visit: Payer: Self-pay | Admitting: Internal Medicine

## 2021-01-21 ENCOUNTER — Other Ambulatory Visit: Payer: Self-pay | Admitting: Internal Medicine

## 2021-01-29 ENCOUNTER — Telehealth: Payer: Self-pay

## 2021-01-31 ENCOUNTER — Ambulatory Visit: Payer: Medicare Other | Admitting: Internal Medicine

## 2021-02-06 ENCOUNTER — Other Ambulatory Visit: Payer: Self-pay | Admitting: Internal Medicine

## 2021-02-07 ENCOUNTER — Other Ambulatory Visit: Payer: Self-pay

## 2021-02-07 ENCOUNTER — Encounter: Payer: Self-pay | Admitting: Internal Medicine

## 2021-02-07 ENCOUNTER — Ambulatory Visit (INDEPENDENT_AMBULATORY_CARE_PROVIDER_SITE_OTHER): Payer: Medicare Other | Admitting: Internal Medicine

## 2021-02-07 VITALS — BP 141/80 | HR 61 | Temp 98.1°F | Resp 18 | Ht 64.0 in | Wt 178.0 lb

## 2021-02-07 DIAGNOSIS — Z0100 Encounter for examination of eyes and vision without abnormal findings: Secondary | ICD-10-CM

## 2021-02-07 DIAGNOSIS — E119 Type 2 diabetes mellitus without complications: Secondary | ICD-10-CM

## 2021-02-07 DIAGNOSIS — H9190 Unspecified hearing loss, unspecified ear: Secondary | ICD-10-CM

## 2021-02-07 DIAGNOSIS — Z23 Encounter for immunization: Secondary | ICD-10-CM | POA: Diagnosis not present

## 2021-02-07 DIAGNOSIS — E785 Hyperlipidemia, unspecified: Secondary | ICD-10-CM

## 2021-02-07 DIAGNOSIS — E039 Hypothyroidism, unspecified: Secondary | ICD-10-CM | POA: Diagnosis not present

## 2021-02-07 DIAGNOSIS — I10 Essential (primary) hypertension: Secondary | ICD-10-CM

## 2021-02-07 MED ORDER — ESCITALOPRAM OXALATE 10 MG PO TABS: 20.0000 mg | ORAL_TABLET | Freq: Every day | ORAL | 4 refills | Status: DC

## 2021-02-07 NOTE — Patient Instructions (Addendum)
Recommend to proceed with the following vaccines at your pharmacy:  Covid #4 Tdap (tetanus)  You are due for your diabetic eye exam. Please see your eye doctor. We ask that they fax over the office visit notes to [place] at [phone].   You need to see an audiologist, please call this number and schedule an appointment [phone]  Continue ergocalciferol weekly Start vitamin D over-the-counter either 1000 or 2000 units daily   Increase Lexapro 10 mg: Take 1.5 tablets for 2 weeks Then take 2 tablets every day until you see me next  GO TO THE LAB : Get the blood work     [place], [place] back for a checkup in 4 months     Fall Prevention in the Home, Adult Falls can cause injuries and can affect people from all age groups. There are many simple things that you can do to make your home safe and to help prevent falls. Ask for help when making these changes, if needed. What actions can I take to prevent falls? General instructions Use good lighting in all rooms. Replace any light bulbs that burn out. Turn on lights if it is dark. Use night-lights. Place frequently used items in easy-to-reach places. Lower the shelves around your home if necessary. Set up furniture so that there are clear paths around it. Avoid moving your furniture around. Remove throw rugs and other tripping hazards from the floor. Avoid walking on wet floors. Fix any uneven floor surfaces. Add color or contrast paint or tape to grab bars and handrails in your home. Place contrasting color strips on the first and last steps of stairways. When you use a stepladder, make sure that it is completely opened and that the sides are firmly locked. Have someone hold the ladder while you are using it. Do not climb a closed stepladder. Be aware of any and all pets. What can I do in the bathroom?   Keep the floor dry. Immediately clean up any water that spills onto the  floor. Remove soap buildup in the tub or shower on a regular basis. Use non-skid mats or decals on the floor of the tub or shower. Attach bath mats securely with double-sided, non-slip rug tape. If you need to sit down while you are in the shower, use a plastic, non-slip stool. Install grab bars by the toilet and in the tub and shower. Do not use towel bars as grab bars. What can I do in the bedroom? Make sure that a bedside light is easy to reach. Do not use oversized bedding that drapes onto the floor. Have a firm chair that has side arms to use for getting dressed. What can I do in the kitchen? Clean up any spills right away. If you need to reach for something above you, use a sturdy step stool that has a grab bar. Keep electrical cables out of the way. Do not use floor polish or wax that makes floors slippery. If you must use wax, make sure that it is non-skid floor wax. What can I do in the stairways? Do not leave any items on the stairs. Make sure that you have a light switch at the top of the stairs and the bottom of the stairs. Have them installed if you do not have them. Make sure that there are handrails on both sides of the stairs. Fix handrails that are broken or loose. Make sure that handrails are as long as  the stairways. Install non-slip stair treads on all stairs in your home. Avoid having throw rugs at the top or bottom of stairways, or secure the rugs with carpet tape to prevent them from moving. Choose a carpet design that does not hide the edge of steps on the stairway. Check any carpeting to make sure that it is firmly attached to the stairs. Fix any carpet that is loose or worn. What can I do on the outside of my home? Use bright outdoor lighting. Regularly repair the edges of walkways and driveways and fix any cracks. Remove high doorway thresholds. Trim any shrubbery on the main path into your home. Regularly check that handrails are securely fastened and in good  repair. Both sides of any steps should have handrails. Install guardrails along the edges of any raised decks or porches. Clear walkways of debris and clutter, including tools and rocks. Have leaves, snow, and ice cleared regularly. Use sand or salt on walkways during winter months. In the garage, clean up any spills right away, including grease or oil spills. What other actions can I take? Wear closed-toe shoes that fit well and support your feet. Wear shoes that have rubber soles or low heels. Use mobility aids as needed, such as canes, walkers, scooters, and crutches. Review your medicines with your health care provider. Some medicines can cause dizziness or changes in blood pressure, which increase your risk of falling. Talk with your health care provider about other ways that you can decrease your risk of falls. This may include working with a physical therapist or trainer to improve your strength, balance, and endurance. Where to find more information Centers for Disease Control and Prevention, [employer]: WebmailGuide.co.za [employer] on Aging: BrainJudge.co.uk Contact a health care provider if: You are afraid of falling at home. You feel weak, drowsy, or dizzy at home. You fall at home. Summary There are many simple things that you can do to make your home safe and to help prevent falls. Ways to make your home safe include removing tripping hazards and installing grab bars in the bathroom. Ask for help when making these changes in your home. This information is not intended to replace advice given to you by your health care provider. Make sure you discuss any questions you have with your health care provider. Document Revised: 03/26/2017 Document Reviewed: 11/26/2016 Elsevier Patient Education  2021 [employer].

## 2021-02-07 NOTE — Progress Notes (Signed)
Subjective:    Patient ID: Shari Drake, female    DOB: 1945-10-29, 75 y.o.   MRN: [id number]  DOS:  02/07/2021 Type of visit - description: Follow-up  Since the last office visit is doing okay. Did report her anxiety is not well controlled. Apparently her ex-husband is having some health issues and she is anxious about it.  Had a single fall a month ago, she thinks she injured her right arm, she is currently asymptomatic.  It was a mechanical fall.  No LOC, no syncope.  In addition we talked about all her chronic medical problems.     Review of Systems See above   Past Medical History:  Diagnosis Date   Anxiety    Breast cancer ([place]) 11/2010   Chronic kidney disease    Depression    Diabetes ([place])    History of chicken pox    History of kidney stones    Hyperlipidemia    Hypertension    Hypothyroidism    Insomnia    Migraine    Polio    at 62 months old   Rosacea    Thyroid cancer ([place]) 2013    Past Surgical History:  Procedure Laterality Date   APPENDECTOMY     BREAST SURGERY     masectomy- L and [place]  09/30/1995   EXTRACORPOREAL SHOCK WAVE LITHOTRIPSY  08/27/2017   TOTAL THYROIDECTOMY  05/05/2011   TUBAL LIGATION      Allergies as of 02/07/2021       Reactions   Iohexol    Wellbutrin [bupropion]    Iodinated Diagnostic Agents Nausea And Vomiting        Medication List        Accurate as of February 07, 2021 11:59 PM. If you have any questions, ask your nurse or doctor.          aspirin-acetaminophen-caffeine 250-250-65 MG tablet Commonly known as: EXCEDRIN MIGRAINE Take by mouth every 6 (six) hours as needed for headache.   escitalopram 10 MG tablet Commonly known as: LEXAPRO Take 2 tablets (20 mg total) by mouth daily. What changed: how much to take Changed by: Kathlene November, MD   levothyroxine 125 MCG tablet Commonly known as: SYNTHROID TAKE ONE TABLET BY MOUTH DAILY BEFORE  BREAKFAST   losartan 50 MG tablet Commonly known as: COZAAR Take 1 tablet (50 mg total) by mouth daily.   Melatonin 200 MCG Tabs Take 200 mcg by mouth daily.   metFORMIN 500 MG 24 hr tablet Commonly known as: GLUCOPHAGE-XR Take 1 tablet (500 mg total) by mouth 2 (two) times daily with a meal.   metoprolol tartrate 25 MG tablet Commonly known as: LOPRESSOR TAKE ONE TABLET BY MOUTH TWICE A DAY   OneTouch Delica Plus [id number] Misc CHECK BLOOD SUGARS TWO TIMES A DAY   OneTouch Verio test strip Generic drug: glucose blood Check blood sugars twice daily   Vitamin D (Ergocalciferol) 1.25 MG (50000 UNIT) Caps capsule Commonly known as: DRISDOL Take 1 capsule (50,000 Units total) by mouth every 7 (seven) days.           Objective:   Physical Exam BP (!) 141/80 (BP Location: Left Arm, Patient Position: Sitting, Cuff Size: Small)   Pulse 61   Temp 98.1 F (36.7 C) (Oral)   Resp 18   Ht 5\' 4"  (1.626 m)   Wt 178 lb (80.7 kg)   SpO2 96%  BMI 30.55 kg/m  General:   Well developed, NAD, BMI noted. HEENT:  Normocephalic . Face symmetric, atraumatic. HOH noted Lungs:  CTA B Normal respiratory effort, no intercostal retractions, no accessory muscle use. Heart: RRR,  no murmur.  DM foot exam: No edema, good pedal pulses, pinprick examination normal Skin: Not pale. Not jaundice Neurologic:  alert & oriented X3.  Speech normal, gait appropriate for age and unassisted Psych--  Cognition and judgment appear intact.  Cooperative with normal attention span and concentration.  Behavior appropriate. No anxious or depressed appearing.      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  Foot exam  negative. HTN: Ambulatory BPs great, usually in the 110s.  Continue losartan, metoprolol.  Check CMP Dyslipidemia: On no meds, explained that all diabetics benefit from statins.  Check FLP. History of breast cancer: See last visit, she had a couple of small lumps at the chest, saw surgery, she was reassured, they felt to be benign Hypothyroidism: Check TSH HOH: Today, she reports unilateral difficulty hearing, recommend audiology referral Anxiety: Has increased lately, she wonders if she could increase meds,we agree to increase lexapro to 20 mg daily Recent fall: Prevention discussed, reports that she is very conscious about falling and very careful. Vitamin D deficiency:   currently finishing ergocalciferol, rec  to start over-the-counter's and recheck on RTC 4 months routine checkup Preventive care: Tdap recommended COVID booster recommended Flu shot today Cologuard and DEXA not done, reassess at the next physical  RTC 4 m    This visit occurred during the SARS-CoV-2 public health emergency.  Safety protocols were in place, including screening questions prior to the visit, additional usage of staff PPE, and extensive cleaning of exam room while observing appropriate contact time as indicated for disinfecting solutions.

## 2021-02-08 DIAGNOSIS — E785 Hyperlipidemia, unspecified: Secondary | ICD-10-CM | POA: Insufficient documentation

## 2021-02-08 LAB — COMPREHENSIVE METABOLIC PANEL
AG Ratio: 1.7 (calc) (ref 1.0–2.5)
ALT: 16 U/L (ref 6–29)
AST: 19 U/L (ref 10–35)
Albumin: 4.1 g/dL (ref 3.6–5.1)
Alkaline phosphatase (APISO): 63 U/L (ref 37–153)
BUN: 18 mg/dL (ref 7–25)
CO2: 27 mmol/L (ref 20–32)
Calcium: 9.7 mg/dL (ref 8.6–10.4)
Chloride: 102 mmol/L (ref 98–110)
Creat: 0.76 mg/dL (ref 0.60–1.00)
Globulin: 2.4 g/dL (calc) (ref 1.9–3.7)
Glucose, Bld: 119 mg/dL — ABNORMAL HIGH (ref 65–99)
Potassium: 4.9 mmol/L (ref 3.5–5.3)
Sodium: 140 mmol/L (ref 135–146)
Total Bilirubin: 0.6 mg/dL (ref 0.2–1.2)
Total Protein: 6.5 g/dL (ref 6.1–8.1)

## 2021-02-08 LAB — LIPID PANEL
Cholesterol: 175 mg/dL (ref <200)
HDL: 39 mg/dL — ABNORMAL LOW (ref >=50)
LDL Cholesterol (Calc): 102 mg/dL (calc) — ABNORMAL HIGH
Non-HDL Cholesterol (Calc): 136 mg/dL (calc) — ABNORMAL HIGH (ref <130)
Total CHOL/HDL Ratio: 4.5 (calc) (ref <5.0)
Triglycerides: 220 mg/dL — ABNORMAL HIGH (ref <150)

## 2021-02-08 LAB — TSH: TSH: 0.72 mIU/L (ref 0.40–4.50)

## 2021-02-08 LAB — HEMOGLOBIN A1C
Hgb A1c MFr Bld: 6.7 % of total Hgb — ABNORMAL HIGH (ref <5.7)
Mean Plasma Glucose: 146 mg/dL
eAG (mmol/L): 8.1 mmol/L

## 2021-02-08 NOTE — Assessment & Plan Note (Signed)
DM: Currently on metformin, ambulatory CBGs 150 typically, check A1c.  Foot exam negative. HTN: Ambulatory BPs great, usually in the 110s.  Continue losartan, metoprolol.  Check CMP Dyslipidemia: On no meds, explained that all diabetics benefit from statins.  Check FLP. History of breast cancer: See last visit, she had a couple of small lumps at the chest, saw surgery, she was reassured, they felt to be benign Hypothyroidism: Check TSH HOH: Today, she reports unilateral difficulty hearing, recommend audiology referral Anxiety: Has increased lately, she wonders if she could increase meds,we agree to increase lexapro to 20 mg daily Recent fall: Prevention discussed, reports that she is very conscious about falling and very careful. Vitamin D deficiency:   currently finishing ergocalciferol, rec  to start over-the-counter's and recheck on RTC 4 months routine checkup Preventive care: Tdap recommended COVID booster recommended Flu shot today Cologuard and DEXA not done, reassess at the next physical  RTC 4 m

## 2021-02-10 NOTE — Telephone Encounter (Signed)
Results mailed 

## 2021-02-10 NOTE — Telephone Encounter (Signed)
Pt would like her results mailed to her.

## 2021-02-22 ENCOUNTER — Other Ambulatory Visit: Payer: Self-pay | Admitting: Internal Medicine

## 2021-03-07 ENCOUNTER — Other Ambulatory Visit: Payer: Self-pay | Admitting: Internal Medicine

## 2021-03-15 ENCOUNTER — Other Ambulatory Visit: Payer: Self-pay | Admitting: Internal Medicine

## 2021-04-28 ENCOUNTER — Other Ambulatory Visit: Payer: Self-pay | Admitting: Internal Medicine

## 2021-05-08 ENCOUNTER — Ambulatory Visit (INDEPENDENT_AMBULATORY_CARE_PROVIDER_SITE_OTHER): Payer: Medicare Other

## 2021-05-08 VITALS — Ht 64.0 in | Wt 175.0 lb

## 2021-05-08 DIAGNOSIS — Z0000 Encounter for general adult medical examination without abnormal findings: Secondary | ICD-10-CM

## 2021-05-08 NOTE — Progress Notes (Addendum)
Subjective:   Shari Drake is a 76 y.o. female who presents for an Initial Medicare Annual Wellness Visit.  I connected with Shari Drake today by telephone and verified that I am speaking with the correct person using two identifiers. Location patient: home Location provider: work Persons participating in the virtual visit: patient, Marine scientist.    I discussed the limitations, risks, security and privacy concerns of performing an evaluation and management service by telephone and the availability of in person appointments. I also discussed with the patient that there may be a patient responsible charge related to this service. The patient expressed understanding and verbally consented to this telephonic visit.    Interactive audio and video telecommunications were attempted between this provider and patient, however failed, due to patient having technical difficulties OR patient did not have access to video capability.  We continued and completed visit with audio only.  Some vital signs may be absent or patient reported.   Time Spent with patient on telephone encounter: 20 minutes   Review of Systems     Cardiac Risk Factors include: advanced age (>16men, >55 women);hypertension;diabetes mellitus;dyslipidemia;obesity (BMI >30kg/m2)     Objective:    Today's Vitals   05/08/21 1022  Weight: 175 lb (79.4 kg)  Height: 5\' 4"  (1.626 m)   Body mass index is 30.04 kg/m.  Advanced Directives 05/08/2021  Does Patient Have a Medical Advance Directive? Yes  Type of Paramedic of [place];Living will  Copy of [place] in Chart? No - copy requested    Current Medications (verified) Outpatient Encounter Medications as of 05/08/2021  Medication Sig   aspirin-acetaminophen-caffeine (EXCEDRIN MIGRAINE) 250-250-65 MG tablet Take by mouth every 6 (six) hours as needed for headache.    escitalopram (LEXAPRO) 10 MG tablet TAKE ONE TABLET BY MOUTH DAILY    Lancets (ONETOUCH DELICA PLUS [id number]) MISC CHECK BLOOD SUGARS TWO TIMES A DAY   levothyroxine (SYNTHROID) 125 MCG tablet TAKE ONE TABLET BY MOUTH DAILY BEFORE BREAKFAST   losartan (COZAAR) 50 MG tablet TAKE ONE TABLET BY MOUTH DAILY   Melatonin 200 MCG TABS Take 200 mcg by mouth daily.   metFORMIN (GLUCOPHAGE-XR) 500 MG 24 hr tablet TAKE ONE TABLET BY MOUTH TWICE A DAY WITH MEALS   metoprolol tartrate (LOPRESSOR) 25 MG tablet TAKE ONE TABLET BY MOUTH TWICE A DAY   ONETOUCH VERIO test strip Check blood sugars twice daily   Vitamin D, Ergocalciferol, (DRISDOL) 1.25 MG (50000 UNIT) CAPS capsule Take 1 capsule (50,000 Units total) by mouth every 7 (seven) days.   No facility-administered encounter medications on file as of 05/08/2021.    Allergies (verified) Iohexol, Wellbutrin [bupropion], and Iodinated contrast media   History: Past Medical History:  Diagnosis Date   Anxiety    Breast cancer ([place]) 11/2010   Chronic kidney disease    Depression    Diabetes ([place])    History of chicken pox    History of kidney stones    Hyperlipidemia    Hypertension    Hypothyroidism    Insomnia    Migraine    Polio    at 53 months old   Rosacea    Thyroid cancer ([place]) 2013   Past Surgical History:  Procedure Laterality Date   APPENDECTOMY     BREAST SURGERY     masectomy- [place] and [place]  09/30/1995   EXTRACORPOREAL SHOCK WAVE LITHOTRIPSY  08/27/2017  TOTAL THYROIDECTOMY  05/05/2011   TUBAL LIGATION     Family History  Problem Relation Age of Onset   Diabetes Brother    Heart disease Mother    Hypertension Mother    Heart disease Father 8       Acute MI   Cancer Son    Heart disease Maternal Grandmother    Heart disease Maternal Grandfather    Heart disease Paternal Grandmother    Heart disease Paternal Grandfather    Cancer Brother    Colon cancer Neg Hx    Breast cancer Neg Hx    Social History   Socioeconomic History    Marital status: Divorced    Spouse name: Not on file   Number of children: 2   Years of education: Not on file   Highest education level: Not on file  Occupational History   Occupation: LPN, 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   Tobacco Use   Smoking status: Never   Smokeless tobacco: Never  Substance and Sexual Activity   Alcohol use: Not Currently   Drug use: Never   Sexual activity: Not Currently  Other Topics Concern   Not on file  Social History Narrative   Retired Corporate treasurer   2 sons ([place], [place])   Lives by herself in [place] Determinants of Health   Financial Resource Strain: Low Risk    Difficulty of Paying Living Expenses: Not hard at all  Food Insecurity: No Food Insecurity   Worried About Charity fundraiser in the Last Year: Never true   Arboriculturist in the Last Year: Never true  Transportation Needs: No Transportation Needs   Lack of Transportation (Medical): No   Lack of Transportation (Non-Medical): No  Physical Activity: Insufficiently Active   Days of Exercise per Week: 3 days   Minutes of Exercise per Session: 30 min  Stress: No Stress Concern Present   Feeling of Stress : Not at all  Social Connections: Socially Isolated   Frequency of Communication with Friends and Family: Three times a week   Frequency of Social Gatherings with Friends and Family: Once a week   Attends Religious Services: Never   Marine scientist or Organizations: No   Attends Music therapist: Never   Marital Status: Divorced    Tobacco Counseling Counseling given: Not Answered   Clinical Intake:  Pre-visit preparation completed: Yes  Pain : No/denies pain     BMI - recorded: 30.04 Nutritional Status: BMI > 30  Obese Nutritional Risks: None Diabetes: Yes CBG done?: No Did pt. bring in CBG monitor from home?: No (phone)  How often do you need to have someone help you when you read instructions, pamphlets, or other written materials from your doctor or  pharmacy?: 1 - Never  Diabetes:  Is the patient diabetic?  Yes  If diabetic, was a CBG obtained today?  No  Did the patient bring in their glucometer from home?  No phone visit How often do you monitor your CBG's? Twice daily   Financial Strains and Diabetes Management:  Are you having any financial strains with the device, your supplies or your medication? No .  Does the patient want to be seen by Chronic Care Management for management of their diabetes?  No  Would the patient like to be referred to a Nutritionist or for Diabetic Management?  No   Diabetic Exams:  Diabetic Eye Exam: . Overdue for diabetic eye exam. Pt has been  advised about the importance in completing this exam. Patient plans to make an appt.  Diabetic Foot Exam: Completed 02/07/2021.   Interpreter Needed?: No  Information entered by :: Caroleen Hamman LPN   Activities of Daily Living In your present state of health, do you have any difficulty performing the following activities: 05/08/2021 02/07/2021  Hearing? N Y  Vision? N Y  Difficulty concentrating or making decisions? N N  Walking or climbing stairs? N N  Dressing or bathing? N N  Doing errands, shopping? N N  Preparing Food and eating ? N -  Using the Toilet? N -  In the past six months, have you accidently leaked urine? N -  Do you have problems with loss of bowel control? N -  Managing your Medications? N -  Managing your Finances? N -  Housekeeping or managing your Housekeeping? N -  Some recent data might be hidden    Patient Care Team: Colon Branch, MD as PCP - General (Internal Medicine) Amalia Greenhouse, MD as Referring Physician (Endocrinology) Christy Sartorius, MD as Referring Physician (Urology) de Kateri Mc, OD as Consulting Physician (Optometry)  Indicate any recent Medical Services you may have received from other than [facility] providers in the past year (date may be approximate).     Assessment:   This is a routine wellness  examination for [place].  Hearing/Vision screen Hearing Screening - Comments:: C/o mild hearing loss Vision Screening - Comments:: Last eye exam-several years ago  Dietary issues and exercise activities discussed: Current Exercise Habits: Home exercise routine, Type of exercise: walking, Time (Minutes): 30, Frequency (Times/Week): 3, Weekly Exercise (Minutes/Week): 90, Exercise limited by: None identified   Goals Addressed             This Visit's Progress    Patient Stated       Do more walking       Depression Screen PHQ 2/9 Scores 05/08/2021 02/07/2021 08/02/2020 03/29/2020 10/06/2019  PHQ - 2 Score 0 0 0 0 1  PHQ- 9 Score - 1 2 2 7     Fall Risk Fall Risk  05/08/2021 02/07/2021 06/11/2020  Falls in the past year? 1 1 0  Number falls in past yr: 0 0 0  Injury with Fall? 0 1 0  Follow up Falls prevention discussed Falls evaluation completed -    FALL RISK PREVENTION PERTAINING TO THE HOME:  Any stairs in or around the home? No  Home free of loose throw rugs in walkways, pet beds, electrical cords, etc? No  Adequate lighting in your home to reduce risk of falls? Yes   ASSISTIVE DEVICES UTILIZED TO PREVENT FALLS:  Life alert? No  Use of a cane, walker or w/c? No  Grab bars in the bathroom? Yes  Shower chair or bench in shower? No  Elevated toilet seat or a handicapped toilet? No   TIMED UP AND GO:  Was the test performed? No . Phone visit   Cognitive Function:Normal cognitive status assessed by this Nurse Health Advisor. No abnormalities found.          Immunizations Immunization History  Administered Date(s) Administered   Fluad Quad(high Dose 65+) 03/29/2020, 02/07/2021   Influenza, High Dose Seasonal PF 01/16/2016, 02/10/2017   Influenza-Unspecified 01/25/2015   PFIZER(Purple Top)SARS-COV-2 Vaccination 05/19/2019, 06/09/2019, 02/05/2020   Pneumococcal Conjugate-13 11/05/2017   Pneumococcal Polysaccharide-23 02/25/2011, 08/02/2020   Td 05/24/2006     TDAP status: Due, Education has been provided regarding the importance of this vaccine. Advised  may receive this vaccine at local pharmacy or Health Dept. Aware to provide a copy of the vaccination record if obtained from local pharmacy or Health Dept. Verbalized acceptance and understanding.  Flu Vaccine status: Up to date  Pneumococcal vaccine status: Up to date  Covid-19 vaccine status: Completed vaccines-per patient-specific dates unknown  Qualifies for Shingles Vaccine? Yes   Zostavax completed No   Shingrix Completed?: No.    Education has been provided regarding the importance of this vaccine. Patient has been advised to call insurance company to determine out of pocket expense if they have not yet received this vaccine. Advised may also receive vaccine at local pharmacy or Health Dept. Verbalized acceptance and understanding.  Screening Tests Health Maintenance  Topic Date Due   OPHTHALMOLOGY EXAM  Never done   DEXA SCAN  Never done   TETANUS/TDAP  05/24/2016   COVID-19 Vaccine (4 - Booster for Pfizer series) 04/01/2020   Zoster Vaccines- Shingrix (1 of 2) 02/07/2022 (Originally 08/23/1964)   HEMOGLOBIN A1C  08/08/2021   FOOT EXAM  02/07/2022   Pneumonia Vaccine 39+ Years old  Completed   INFLUENZA VACCINE  Completed   Hepatitis C Screening  Completed   HPV VACCINES  Aged Out    Health Maintenance  Health Maintenance Due  Topic Date Due   OPHTHALMOLOGY EXAM  Never done   DEXA SCAN  Never done   TETANUS/TDAP  05/24/2016   COVID-19 Vaccine (4 - Booster for Pfizer series) 04/01/2020    Colorectal cancer screening: No longer required.   Mammogram status: No longer required due to bilateral mastectomy.  Bone Density status: Ordered 07/2020. Pt provided with contact info and advised to call to schedule appt.  Lung Cancer Screening: (Low Dose CT Chest recommended if Age 73-80 years, 30 pack-year currently smoking OR have quit w/in 15years.) does not qualify.      Additional Screening:  Hepatitis C Screening: Completed 08/02/2020  Vision Screening: Recommended annual ophthalmology exams for early detection of glaucoma and other disorders of the eye. Is the patient up to date with their annual eye exam?  No  Who is the provider or what is the name of the office in which the patient attends annual eye exams? Pt unsure of name   Dental Screening: Recommended annual dental exams for proper oral hygiene  Community Resource Referral / Chronic Care Management: CRR required this visit?  No   CCM required this visit?  No      Plan:     I have personally reviewed and noted the following in the patients chart:   Medical and social history Use of alcohol, tobacco or illicit drugs  Current medications and supplements including opioid prescriptions. Patient is not currently taking opioid prescriptions. Functional ability and status Nutritional status Physical activity Advanced directives List of other physicians Hospitalizations, surgeries, and ER visits in previous 12 months Vitals Screenings to include cognitive, depression, and falls Referrals and appointments  In addition, I have reviewed and discussed with patient certain preventive protocols, quality metrics, and best practice recommendations. A written personalized care plan for preventive services as well as general preventive health recommendations were provided to patient.   Due to this being a telephonic visit, the after visit summary with patients personalized plan was offered to patient via mail or my-chart. Per request, patient was mailed a copy of [place], LPN   [id number]  Nurse Health Advisor  Nurse Notes: None  I have reviewed and agree with [facility] documentation.  Kathlene November, MD

## 2021-05-08 NOTE — Patient Instructions (Signed)
Shari Drake , Thank you for taking time to complete your Medicare Wellness Visit. I appreciate your ongoing commitment to your health goals. Please review the following plan we discussed and let me know if I can assist you in the future.   Screening recommendations/referrals: Colonoscopy: No longer required Mammogram: No longer required Bone Density: Due. Please call when you are ready to schedule. Recommended yearly ophthalmology/optometry visit for glaucoma screening and checkup Recommended yearly dental visit for hygiene and checkup  Vaccinations: Influenza vaccine: Up to date Pneumococcal vaccine: Up to date Tdap vaccine: May obtain vaccine at your local pharmacy. Shingles vaccine:   May obtain vaccine at your local pharmacy. Covid-19:Up to date-Specific date of booster unknown.  Advanced directives: Please bring a copy of Living Will and/or Healthcare Power of Attorney for your chart.   Conditions/risks identified: See problem list  Next appointment: Follow up in one year for your annual wellness visit    Preventive Care 65 Years and Older, Female Preventive care refers to lifestyle choices and visits with your health care provider that can promote health and wellness. What does preventive care include? A yearly physical exam. This is also called an annual well check. Dental exams once or twice a year. Routine eye exams. Ask your health care provider how often you should have your eyes checked. Personal lifestyle choices, including: Daily care of your teeth and gums. Regular physical activity. Eating a healthy diet. Avoiding tobacco and drug use. Limiting alcohol use. Practicing safe sex. Taking low-dose aspirin every day. Taking vitamin and mineral supplements as recommended by your health care provider. What happens during an annual well check? The services and screenings done by your health care provider during your annual well check will depend on your age, overall  health, lifestyle risk factors, and family history of disease. Counseling  Your health care provider may ask you questions about your: Alcohol use. Tobacco use. Drug use. Emotional well-being. Home and relationship well-being. Sexual activity. Eating habits. History of falls. Memory and ability to understand (cognition). Work and work Statistician. Reproductive health. Screening  You may have the following tests or measurements: Height, weight, and BMI. Blood pressure. Lipid and cholesterol levels. These may be checked every 5 years, or more frequently if you are over 86 years old. Skin check. Lung cancer screening. You may have this screening every year starting at age 66 if you have a 30-pack-year history of smoking and currently smoke or have quit within the past 15 years. Fecal occult blood test (FOBT) of the stool. You may have this test every year starting at age 50. Flexible sigmoidoscopy or colonoscopy. You may have a sigmoidoscopy every 5 years or a colonoscopy every 10 years starting at age 4. Hepatitis C blood test. Hepatitis B blood test. Sexually transmitted disease (STD) testing. Diabetes screening. This is done by checking your blood sugar (glucose) after you have not eaten for a while (fasting). You may have this done every 1-3 years. Bone density scan. This is done to screen for osteoporosis. You may have this done starting at age 58. Mammogram. This may be done every 1-2 years. Talk to your health care provider about how often you should have regular mammograms. Talk with your health care provider about your test results, treatment options, and if necessary, the need for more tests. Vaccines  Your health care provider may recommend certain vaccines, such as: Influenza vaccine. This is recommended every year. Tetanus, diphtheria, and acellular pertussis (Tdap, Td) vaccine. You may need  a Td booster every 10 years. Zoster vaccine. You may need this after age  65. Pneumococcal 13-valent conjugate (PCV13) vaccine. One dose is recommended after age 30. Pneumococcal polysaccharide (PPSV23) vaccine. One dose is recommended after age 50. Talk to your health care provider about which screenings and vaccines you need and how often you need them. This information is not intended to replace advice given to you by your health care provider. Make sure you discuss any questions you have with your health care provider. Document Released: 05/10/2015 Document Revised: 01/01/2016 Document Reviewed: 02/12/2015 Elsevier Interactive Patient Education  2017 [employer] Prevention in the Home Falls can cause injuries. They can happen to people of all ages. There are many things you can do to make your home safe and to help prevent falls. What can I do on the outside of my home? Regularly fix the edges of walkways and driveways and fix any cracks. Remove anything that might make you trip as you walk through a door, such as a raised step or threshold. Trim any bushes or trees on the path to your home. Use bright outdoor lighting. Clear any walking paths of anything that might make someone trip, such as rocks or tools. Regularly check to see if handrails are loose or broken. Make sure that both sides of any steps have handrails. Any raised decks and porches should have guardrails on the edges. Have any leaves, snow, or ice cleared regularly. Use sand or salt on walking paths during winter. Clean up any spills in your garage right away. This includes oil or grease spills. What can I do in the bathroom? Use night lights. Install grab bars by the toilet and in the tub and shower. Do not use towel bars as grab bars. Use non-skid mats or decals in the tub or shower. If you need to sit down in the shower, use a plastic, non-slip stool. Keep the floor dry. Clean up any water that spills on the floor as soon as it happens. Remove soap buildup in the tub or shower  regularly. Attach bath mats securely with double-sided non-slip rug tape. Do not have throw rugs and other things on the floor that can make you trip. What can I do in the bedroom? Use night lights. Make sure that you have a light by your bed that is easy to reach. Do not use any sheets or blankets that are too big for your bed. They should not hang down onto the floor. Have a firm chair that has side arms. You can use this for support while you get dressed. Do not have throw rugs and other things on the floor that can make you trip. What can I do in the kitchen? Clean up any spills right away. Avoid walking on wet floors. Keep items that you use a lot in easy-to-reach places. If you need to reach something above you, use a strong step stool that has a grab bar. Keep electrical cords out of the way. Do not use floor polish or wax that makes floors slippery. If you must use wax, use non-skid floor wax. Do not have throw rugs and other things on the floor that can make you trip. What can I do with my stairs? Do not leave any items on the stairs. Make sure that there are handrails on both sides of the stairs and use them. Fix handrails that are broken or loose. Make sure that handrails are as long as the stairways. Check  any carpeting to make sure that it is firmly attached to the stairs. Fix any carpet that is loose or worn. Avoid having throw rugs at the top or bottom of the stairs. If you do have throw rugs, attach them to the floor with carpet tape. Make sure that you have a light switch at the top of the stairs and the bottom of the stairs. If you do not have them, ask someone to add them for you. What else can I do to help prevent falls? Wear shoes that: Do not have high heels. Have rubber bottoms. Are comfortable and fit you well. Are closed at the toe. Do not wear sandals. If you use a stepladder: Make sure that it is fully opened. Do not climb a closed stepladder. Make sure that  both sides of the stepladder are locked into place. Ask someone to hold it for you, if possible. Clearly mark and make sure that you can see: Any grab bars or handrails. First and last steps. Where the edge of each step is. Use tools that help you move around (mobility aids) if they are needed. These include: Canes. Walkers. Scooters. Crutches. Turn on the lights when you go into a dark area. Replace any light bulbs as soon as they burn out. Set up your furniture so you have a clear path. Avoid moving your furniture around. If any of your floors are uneven, fix them. If there are any pets around you, be aware of where they are. Review your medicines with your doctor. Some medicines can make you feel dizzy. This can increase your chance of falling. Ask your doctor what other things that you can do to help prevent falls. This information is not intended to replace advice given to you by your health care provider. Make sure you discuss any questions you have with your health care provider. Document Released: 02/07/2009 Document Revised: 09/19/2015 Document Reviewed: 05/18/2014 Elsevier Interactive Patient Education  2017 [employer].

## 2021-05-16 ENCOUNTER — Other Ambulatory Visit: Payer: Self-pay | Admitting: Internal Medicine

## 2021-05-23 ENCOUNTER — Other Ambulatory Visit: Payer: Self-pay | Admitting: Internal Medicine

## 2021-05-28 ENCOUNTER — Other Ambulatory Visit: Payer: Self-pay | Admitting: Internal Medicine

## 2021-06-11 ENCOUNTER — Encounter: Payer: Self-pay | Admitting: Internal Medicine

## 2021-06-13 ENCOUNTER — Ambulatory Visit: Payer: Medicare Other | Admitting: Internal Medicine

## 2021-08-01 ENCOUNTER — Ambulatory Visit: Payer: Medicare Other | Admitting: Internal Medicine

## 2021-08-07 ENCOUNTER — Other Ambulatory Visit: Payer: Self-pay | Admitting: Internal Medicine

## 2021-08-08 ENCOUNTER — Ambulatory Visit: Payer: Medicare Other | Admitting: Internal Medicine

## 2021-08-22 ENCOUNTER — Other Ambulatory Visit: Payer: Self-pay | Admitting: Internal Medicine

## 2021-09-12 ENCOUNTER — Ambulatory Visit: Payer: Medicare Other | Admitting: Internal Medicine

## 2021-09-19 ENCOUNTER — Ambulatory Visit: Payer: Medicare Other | Admitting: Internal Medicine

## 2021-09-23 ENCOUNTER — Other Ambulatory Visit: Payer: Self-pay | Admitting: Internal Medicine

## 2021-10-17 ENCOUNTER — Ambulatory Visit: Payer: Medicare Other | Admitting: Internal Medicine

## 2021-10-22 ENCOUNTER — Ambulatory Visit: Payer: Medicare Other | Admitting: Internal Medicine

## 2021-10-24 ENCOUNTER — Ambulatory Visit (INDEPENDENT_AMBULATORY_CARE_PROVIDER_SITE_OTHER): Payer: Medicare Other | Admitting: Internal Medicine

## 2021-10-24 ENCOUNTER — Encounter: Payer: Self-pay | Admitting: Internal Medicine

## 2021-10-24 VITALS — BP 130/82 | HR 63 | Temp 98.0°F | Resp 16 | Ht 64.0 in | Wt 173.4 lb

## 2021-10-24 DIAGNOSIS — E119 Type 2 diabetes mellitus without complications: Secondary | ICD-10-CM

## 2021-10-24 DIAGNOSIS — E039 Hypothyroidism, unspecified: Secondary | ICD-10-CM | POA: Diagnosis not present

## 2021-10-24 DIAGNOSIS — R002 Palpitations: Secondary | ICD-10-CM

## 2021-10-24 DIAGNOSIS — I10 Essential (primary) hypertension: Secondary | ICD-10-CM | POA: Diagnosis not present

## 2021-10-24 DIAGNOSIS — E785 Hyperlipidemia, unspecified: Secondary | ICD-10-CM | POA: Diagnosis not present

## 2021-10-24 NOTE — Assessment & Plan Note (Signed)
Palpitations: Single episode of palpitations as described above, no red flag symptoms, in the context of taking plain Zyrtec with no decongestant. Admits to some stress. EKG today: Sinus rhythm.  No acute changes.  No old EKGs. Plan:  General labs. Echo Call if symptoms resurface, ER if symptoms associated with chest pain, near fainting, diaphoresis. DM: Reports ambulatory CBGs in the 120s, continue metformin, check A1c HTN: Reports normal ambulatory BPs, BP today is great, continue losartan, metoprolol.  Check BMP and CBC Dyslipidemia: Last LDL was 102, declined Lipitor, will discuss again on RTC Depression: On Lexapro, under some stress due to taking care of her ex-husband who has dementia.  Overall, she is coping well, we talk about possibly increase medicines but at this point we agreed on observation. Thyroid disease: On levothyroxine, check TSH Preventive care: COVID booster recommended. Flu shot this fall.  RTC CPX 3 months.

## 2021-10-24 NOTE — Patient Instructions (Addendum)
Recommend the COVID-vaccine booster  Recommend a flu shot this fall  Check the  blood pressure regularly BP GOAL is between 110/65 and  135/85. If it is consistently higher or lower, let me know  We will schedule a echocardiogram because you had palpitations.  If you have again a fast heart rate, let me know. If in addition to the fast heart rate you feel sick, nausea, sweats, chest pain: Call [facility] : Get the blood work     [place], [place] back for physical exam in 3 months    Per our records you are due for your diabetic eye exam. Please contact your eye doctor to schedule an appointment. Please have them send copies of your office visit notes to [place]. Our fax number is [phone]. If you need a referral to an eye doctor please let us know.

## 2021-10-24 NOTE — Progress Notes (Signed)
Subjective:    Patient ID: Shari Drake, female    DOB: November 20, 1945, 76 y.o.   MRN: [id number]  DOS:  10/24/2021 Type of visit - description: Follow-up  A month ago had a episode of palpitations that lasted about an hour. Her heart rate was checked by her blood pressure cuff, it got as high as 130. She denies associated sweats, nausea, chest pain. No LOC. At the time she was taking plain Zyrtec for the second time, since then she has stopped antihistaminic and has no further symptoms.  Admits to stress, taking care of her ex-husband who has dementia.   Review of Systems See above   Past Medical History:  Diagnosis Date   Anxiety    Breast cancer ([place]) 11/2010   Chronic kidney disease    Depression    Diabetes ([place])    History of chicken pox    History of kidney stones    Hyperlipidemia    Hypertension    Hypothyroidism    Insomnia    Migraine    Polio    at 71 months old   Rosacea    Thyroid cancer ([place]) 2013    Past Surgical History:  Procedure Laterality Date   APPENDECTOMY     BREAST SURGERY     masectomy- L and [place]  09/30/1995   EXTRACORPOREAL SHOCK WAVE LITHOTRIPSY  08/27/2017   TOTAL THYROIDECTOMY  05/05/2011   TUBAL LIGATION      Current Outpatient Medications  Medication Instructions   aspirin-acetaminophen-caffeine (EXCEDRIN MIGRAINE) 250-250-65 MG tablet Oral, Every 6 hours PRN   escitalopram (LEXAPRO) 10 MG tablet TAKE TWO TABLETS BY MOUTH DAILY   Lancets (ONETOUCH DELICA PLUS [id number]) MISC CHECK BLOOD SUGARS TWO TIMES A DAY   levothyroxine (SYNTHROID) 125 MCG tablet TAKE ONE TABLET BY MOUTH DAILY BEFORE BREAKFAST   losartan (COZAAR) 50 MG tablet TAKE ONE TABLET BY MOUTH DAILY   Melatonin 200 mcg, Oral, Daily   metFORMIN (GLUCOPHAGE-XR) 500 MG 24 hr tablet TAKE ONE TABLET BY MOUTH TWICE A DAY WITH MEALS   metoprolol tartrate (LOPRESSOR) 25 MG tablet TAKE ONE TABLET BY MOUTH TWICE A DAY    ONETOUCH VERIO test strip USE TO CHECK BLOOD SUGAR TWICE DAILY AS DIRECTED   Vitamin D (Ergocalciferol) (DRISDOL) 50,000 Units, Oral, Every 7 days       Objective:   Physical Exam BP 130/82   Pulse 63   Temp 98 F (36.7 C) (Oral)   Resp 16   Ht '5\' 4"'$  (1.626 m)   Wt 173 lb 6 oz (78.6 kg)   SpO2 96%   BMI 29.76 kg/m  General:   Well developed, NAD, BMI noted. HEENT:  Normocephalic . Face symmetric, atraumatic Neck: No thyromegaly Lungs:  CTA B Normal respiratory effort, no intercostal retractions, no accessory muscle use. Heart: RRR,  no murmur.  Lower extremities: no pretibial edema bilaterally  Skin: Not pale. Not jaundice Neurologic:  alert & oriented X3.  Speech normal, gait appropriate for age and unassisted Psych--  Cognition and judgment appear intact.  Cooperative with normal attention span and concentration.  Behavior appropriate. No anxious or depressed appearing.      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 Admits to some stress. EKG today: Sinus rhythm.  No acute changes.  No old EKGs. Plan:  General labs. Echo Call if symptoms resurface, ER if symptoms associated with chest pain, near fainting, diaphoresis. DM: Reports ambulatory CBGs in the 120s, continue metformin, check A1c HTN: Reports normal ambulatory BPs, BP today is great, continue losartan, metoprolol.  Check BMP and CBC Dyslipidemia: Last LDL was 102, declined Lipitor, will discuss again on RTC Depression: On Lexapro, under some stress due to taking care of her ex-husband who has dementia.  Overall, she is coping well, we talk about possibly  increase medicines but at this point we agreed on observation. Thyroid disease: On levothyroxine, check TSH Preventive care: COVID booster recommended. Flu shot this fall.  RTC CPX 3 months.

## 2021-10-25 LAB — BASIC METABOLIC PANEL
BUN: 15 mg/dL (ref 7–25)
CO2: 25 mmol/L (ref 20–32)
Calcium: 9.8 mg/dL (ref 8.6–10.4)
Chloride: 104 mmol/L (ref 98–110)
Creat: 0.91 mg/dL (ref 0.60–1.00)
Glucose, Bld: 127 mg/dL — ABNORMAL HIGH (ref 65–99)
Potassium: 4.2 mmol/L (ref 3.5–5.3)
Sodium: 140 mmol/L (ref 135–146)

## 2021-10-25 LAB — CBC WITH DIFFERENTIAL/PLATELET
Absolute Monocytes: 523 cells/uL (ref 200–950)
Basophils Absolute: 66 cells/uL (ref 0–200)
Basophils Relative: 0.8 %
Eosinophils Absolute: 208 cells/uL (ref 15–500)
Eosinophils Relative: 2.5 %
HCT: 41.4 % (ref 35.0–45.0)
Hemoglobin: 14.7 g/dL (ref 11.7–15.5)
Lymphs Abs: 3320 cells/uL (ref 850–3900)
MCH: 30.6 pg (ref 27.0–33.0)
MCHC: 35.5 g/dL (ref 32.0–36.0)
MCV: 86.1 fL (ref 80.0–100.0)
MPV: 9.9 fL (ref 7.5–12.5)
Monocytes Relative: 6.3 %
Neutro Abs: 4183 cells/uL (ref 1500–7800)
Neutrophils Relative %: 50.4 %
Platelets: 380 10*3/uL (ref 140–400)
RBC: 4.81 10*6/uL (ref 3.80–5.10)
RDW: 12.8 % (ref 11.0–15.0)
Total Lymphocyte: 40 %
WBC: 8.3 10*3/uL (ref 3.8–10.8)

## 2021-10-25 LAB — HEMOGLOBIN A1C
Hgb A1c MFr Bld: 6.1 % of total Hgb — ABNORMAL HIGH (ref <5.7)
Mean Plasma Glucose: 128 mg/dL
eAG (mmol/L): 7.1 mmol/L

## 2021-10-25 LAB — TSH: TSH: 1.84 mIU/L (ref 0.40–4.50)

## 2021-10-31 ENCOUNTER — Encounter: Payer: Self-pay | Admitting: *Deleted

## 2021-11-02 ENCOUNTER — Other Ambulatory Visit: Payer: Self-pay | Admitting: Internal Medicine

## 2021-11-11 ENCOUNTER — Other Ambulatory Visit: Payer: Self-pay | Admitting: Internal Medicine

## 2021-11-14 ENCOUNTER — Ambulatory Visit (HOSPITAL_COMMUNITY): Payer: Medicare Other | Attending: Cardiology

## 2021-11-14 DIAGNOSIS — N189 Chronic kidney disease, unspecified: Secondary | ICD-10-CM | POA: Diagnosis not present

## 2021-11-14 DIAGNOSIS — E785 Hyperlipidemia, unspecified: Secondary | ICD-10-CM | POA: Diagnosis not present

## 2021-11-14 DIAGNOSIS — I129 Hypertensive chronic kidney disease with stage 1 through stage 4 chronic kidney disease, or unspecified chronic kidney disease: Secondary | ICD-10-CM | POA: Diagnosis not present

## 2021-11-14 DIAGNOSIS — I340 Nonrheumatic mitral (valve) insufficiency: Secondary | ICD-10-CM | POA: Diagnosis not present

## 2021-11-14 DIAGNOSIS — R002 Palpitations: Secondary | ICD-10-CM | POA: Diagnosis not present

## 2021-11-14 DIAGNOSIS — E039 Hypothyroidism, unspecified: Secondary | ICD-10-CM | POA: Diagnosis not present

## 2021-11-14 DIAGNOSIS — I361 Nonrheumatic tricuspid (valve) insufficiency: Secondary | ICD-10-CM | POA: Diagnosis not present

## 2021-11-14 DIAGNOSIS — E1122 Type 2 diabetes mellitus with diabetic chronic kidney disease: Secondary | ICD-10-CM | POA: Insufficient documentation

## 2021-11-14 DIAGNOSIS — Z853 Personal history of malignant neoplasm of breast: Secondary | ICD-10-CM | POA: Insufficient documentation

## 2021-11-14 LAB — ECHOCARDIOGRAM COMPLETE
Area-P 1/2: 4.68 cm2
S' Lateral: 2.4 cm

## 2021-11-17 ENCOUNTER — Telehealth: Payer: Self-pay | Admitting: Internal Medicine

## 2021-11-17 NOTE — Telephone Encounter (Signed)
Patient called to find out if Dr. Larose Kells received her echo results from last Friday. Patient would like a call to go over results. Please call patient to advise results.

## 2021-11-18 NOTE — Telephone Encounter (Signed)
Results mailed yesterday. Spoke w/ Pt this morning- informed of her results. Pt verbalized understanding.

## 2021-12-23 ENCOUNTER — Encounter: Payer: Self-pay | Admitting: Internal Medicine

## 2022-01-22 ENCOUNTER — Telehealth: Payer: Self-pay | Admitting: Internal Medicine

## 2022-01-22 MED ORDER — METOPROLOL TARTRATE 25 MG PO TABS: 25.0000 mg | ORAL_TABLET | Freq: Two times a day (BID) | ORAL | 1 refills | Status: DC

## 2022-01-22 NOTE — Telephone Encounter (Signed)
Medication: metoprolol tartrate (LOPRESSOR) 25 MG tablet [[id number]]    *patient has 3 pills left*   Has the patient contacted their pharmacy?  Yes  Preferred Pharmacy (with phone number or street name): [id number] - [address], [place] [address]  Phone:  [phone]  Fax:  [phone]   Agent: Please be advised that RX refills may take up to 3 business days. We ask that you follow-up with your pharmacy.

## 2022-01-22 NOTE — Telephone Encounter (Signed)
Rx sent 

## 2022-01-23 ENCOUNTER — Ambulatory Visit: Payer: Medicare Other | Admitting: Internal Medicine

## 2022-01-26 ENCOUNTER — Other Ambulatory Visit: Payer: Self-pay | Admitting: Internal Medicine

## 2022-02-13 ENCOUNTER — Encounter: Payer: Self-pay | Admitting: Internal Medicine

## 2022-02-13 ENCOUNTER — Ambulatory Visit (INDEPENDENT_AMBULATORY_CARE_PROVIDER_SITE_OTHER): Payer: Medicare Other | Admitting: Internal Medicine

## 2022-02-13 VITALS — BP 130/68 | HR 68 | Temp 98.1°F | Resp 16 | Ht 64.0 in | Wt 173.2 lb

## 2022-02-13 DIAGNOSIS — E039 Hypothyroidism, unspecified: Secondary | ICD-10-CM

## 2022-02-13 DIAGNOSIS — E785 Hyperlipidemia, unspecified: Secondary | ICD-10-CM | POA: Diagnosis not present

## 2022-02-13 DIAGNOSIS — I10 Essential (primary) hypertension: Secondary | ICD-10-CM | POA: Diagnosis not present

## 2022-02-13 DIAGNOSIS — E119 Type 2 diabetes mellitus without complications: Secondary | ICD-10-CM | POA: Diagnosis not present

## 2022-02-13 DIAGNOSIS — Z23 Encounter for immunization: Secondary | ICD-10-CM | POA: Diagnosis not present

## 2022-02-13 MED ORDER — ATORVASTATIN CALCIUM 10 MG PO TABS: 10.0000 mg | ORAL_TABLET | Freq: Every day | ORAL | 1 refills | Status: DC

## 2022-02-13 NOTE — Patient Instructions (Addendum)
Atorvastatin 10 mg every night  Other medications the same  Vaccines I recommend: Covid booster  Check the  blood pressure regularly BP GOAL is between 110/65 and  135/85. If it is consistently higher or lower, let me know     [place], [place] back for blood work, fasting in 6 weeks  Come back for physical exam by April 2024    Per our records you are due for your diabetic eye exam. Please contact your eye doctor to schedule an appointment. Please have them send copies of your office visit notes to [place]. Our fax number is [phone]. If you need a referral to an eye doctor please let us know.

## 2022-02-13 NOTE — Progress Notes (Unsigned)
Subjective:    Patient ID: Shari Drake, female    DOB: 04-03-1946, 76 y.o.   MRN: [id number]  DOS:  02/13/2022 Type of visit - description: Follow-up  Since the last office visit is doing well. Has no major concerns. Ambulatory BPs and CBGs are very good. Has a 5-year history of dystrophic nail, right.  Treatment?  Review of Systems See above   Past Medical History:  Diagnosis Date   Anxiety    Breast cancer ([place]) 11/2010   Chronic kidney disease    Depression    Diabetes ([place])    History of chicken pox    History of kidney stones    Hyperlipidemia    Hypertension    Hypothyroidism    Insomnia    Migraine    Polio    at 27 months old   Rosacea    Thyroid cancer ([place]) 2013    Past Surgical History:  Procedure Laterality Date   APPENDECTOMY     BREAST SURGERY     masectomy- L and [place]  09/30/1995   EXTRACORPOREAL SHOCK WAVE LITHOTRIPSY  08/27/2017   TOTAL THYROIDECTOMY  05/05/2011   TUBAL LIGATION      Current Outpatient Medications  Medication Instructions   aspirin-acetaminophen-caffeine (EXCEDRIN MIGRAINE) 250-250-65 MG tablet Oral, Every 6 hours PRN   escitalopram (LEXAPRO) 20 mg, Oral, Daily   Lancets (ONETOUCH DELICA PLUS [id number]) MISC USE TO CHECK BLOOD SUGARS TWO TIMES A DAY   levothyroxine (SYNTHROID) 125 MCG tablet TAKE ONE TABLET BY MOUTH DAILY BEFORE BREAKFAST   losartan (COZAAR) 50 MG tablet TAKE ONE TABLET BY MOUTH DAILY   Melatonin 200 mcg, Oral, Daily   metFORMIN (GLUCOPHAGE-XR) 500 mg, Oral, 2 times daily with meals   metoprolol tartrate (LOPRESSOR) 25 mg, Oral, 2 times daily   ONETOUCH VERIO test strip USE TO CHECK BLOOD SUGAR TWICE DAILY AS DIRECTED   Vitamin D (Ergocalciferol) (DRISDOL) 50,000 Units, Oral, Every 7 days       Objective:   Physical Exam BP 130/68   Pulse 68   Temp 98.1 F (36.7 C) (Oral)   Resp 16   Ht '5\' 4"'$  (1.626 m)   Wt 173 lb 4 oz (78.6 kg)   SpO2 96%    BMI 29.74 kg/m  General:   Well developed, NAD, BMI noted. HEENT:  Normocephalic . Face symmetric, atraumatic Lungs:  CTA B Normal respiratory effort, no intercostal retractions, no accessory muscle use. Heart: RRR,  no murmur.  DM foot exam: No edema, good pedal pulses, pinprick examination normal Upper extremities: Fifth right thumb nail: It is indeed destroyed, no hyperpigmentation, cuticle and skin around it normal. Skin: Not pale. Not jaundice Neurologic:  alert & oriented X3.  Speech normal, gait appropriate for age and unassisted Psych--  Cognition and judgment appear intact.  Cooperative with normal attention span and concentration.  Behavior appropriate. No anxious or depressed appearing.      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  Check A1c, further advised with results. Foot exam negative. HTN: On losartan and metoprolol, ambulatory BPs reportedly WNL, check BMP. High cholesterol: With  history of diabetes, statins are indicated, she agrees, start Lipitor 10 mg.  Labs in 6 weeks Palpitations: No further symptoms since last visit, a echocardiogram was done 05-5425, grade 2 diastolic dysfunction.  See full report. Hypothyroidism: Good med compliance, check TSH Depression: Doing well. Dystrophic nail: At the right thumb, offered dermatology referral: Declined. Preventive care: Flu shot today, recommend COVID booster. Labs in 6 weeks: AST, ALT, BMP, FLP, A1c, TSH RTC CPX 07/2021 6- Palpitations: Single episode of palpitations as described above, no red flag symptoms, in the context of taking plain Zyrtec with no decongestant. Admits to some stress. EKG today: Si2,000nus  rhythm.  No acute changes.  No old EKGs. Plan:  General labs. Echo Call if symptoms resurface, ER if symptoms associated with chest pain, near fainting, diaphoresis. DM: Reports ambulatory CBGs in the 120s, continue metformin, check A1c HTN: Reports normal ambulatory BPs, BP today is great, continue losartan, metoprolol.  Check BMP and CBC Dyslipidemia: Last LDL was 102, declined Lipitor, will discuss again on RTC Depression: On Lexapro, under some stress due to taking care of her ex-husband who has dementia.  Overall, she is coping well, we talk about possibly increase medicines but at this point we agreed on observation. Thyroid disease: On levothyroxine, check TSH Preventive care: COVID booster recommended. Flu shot this fall.  RTC CPX 3 months.

## 2022-02-14 NOTE — Assessment & Plan Note (Signed)
DM: On metformin, ambulatory CBGs 110 in the morning, 130 in the afternoon.  Check A1c, further advised with results. Foot exam negative. HTN: On losartan and metoprolol, ambulatory BPs reportedly WNL, check BMP. High cholesterol:  d/t h/o DM, statins are indicated, she agrees, start Lipitor 10 mg.  Labs in 6 weeks Palpitations: No further symptoms since last visit, a echocardiogram was done 04-7979, grade 2 diastolic dysfunction.  See full report. Hypothyroidism: Good med compliance, check TSH Depression: Doing well. Dystrophic nail: At the right thumb, failed to improve w/ OTC antifungals, offered dermatology referral: Declined. Preventive care: Flu shot today, recommend COVID booster. Labs in 6 weeks: AST, ALT, BMP, FLP, A1c, TSH RTC CPX 07/2021

## 2022-03-27 ENCOUNTER — Other Ambulatory Visit (INDEPENDENT_AMBULATORY_CARE_PROVIDER_SITE_OTHER): Payer: Medicare Other

## 2022-03-27 DIAGNOSIS — I10 Essential (primary) hypertension: Secondary | ICD-10-CM | POA: Diagnosis not present

## 2022-03-27 DIAGNOSIS — E119 Type 2 diabetes mellitus without complications: Secondary | ICD-10-CM

## 2022-03-27 DIAGNOSIS — E785 Hyperlipidemia, unspecified: Secondary | ICD-10-CM

## 2022-03-27 DIAGNOSIS — E039 Hypothyroidism, unspecified: Secondary | ICD-10-CM

## 2022-03-27 LAB — BASIC METABOLIC PANEL
BUN: 19 mg/dL (ref 6–23)
CO2: 28 mEq/L (ref 19–32)
Calcium: 9.6 mg/dL (ref 8.4–10.5)
Chloride: 101 mEq/L (ref 96–112)
Creatinine, Ser: 0.76 mg/dL (ref 0.40–1.20)
GFR: 74.96 mL/min (ref >60.00)
Glucose, Bld: 142 mg/dL — ABNORMAL HIGH (ref 70–99)
Potassium: 4.1 mEq/L (ref 3.5–5.1)
Sodium: 139 mEq/L (ref 135–145)

## 2022-03-27 LAB — TSH: TSH: 0.94 u[IU]/mL (ref 0.35–5.50)

## 2022-03-27 LAB — LIPID PANEL
Cholesterol: 96 mg/dL (ref 0–200)
HDL: 40.3 mg/dL (ref >39.00)
LDL Cholesterol: 29 mg/dL (ref 0–99)
NonHDL: 55.52
Total CHOL/HDL Ratio: 2
Triglycerides: 133 mg/dL (ref 0.0–149.0)
VLDL: 26.6 mg/dL (ref 0.0–40.0)

## 2022-03-27 LAB — AST: AST: 12 U/L (ref 0–37)

## 2022-03-27 LAB — ALT: ALT: 9 U/L (ref 0–35)

## 2022-03-27 LAB — HEMOGLOBIN A1C: Hgb A1c MFr Bld: 6.6 % — ABNORMAL HIGH (ref 4.6–6.5)

## 2022-05-10 ENCOUNTER — Other Ambulatory Visit: Payer: Self-pay | Admitting: Internal Medicine

## 2022-05-19 LAB — HM DIABETES EYE EXAM

## 2022-05-22 ENCOUNTER — Ambulatory Visit (INDEPENDENT_AMBULATORY_CARE_PROVIDER_SITE_OTHER): Payer: Medicare Other | Admitting: *Deleted

## 2022-05-22 DIAGNOSIS — Z0000 Encounter for general adult medical examination without abnormal findings: Secondary | ICD-10-CM

## 2022-05-22 NOTE — Progress Notes (Signed)
Subjective:   Shari Drake is a 77 y.o. female who presents for Medicare Annual (Subsequent) preventive examination.  I connected with  Shari Drake on 05/22/22 by a audio enabled telemedicine application and verified that I am speaking with the correct person using two identifiers.  Patient Location: Home  Provider Location: Office/Clinic  I discussed the limitations of evaluation and management by telemedicine. The patient expressed understanding and agreed to proceed.   Review of Systems    Defer to PCP Cardiac Risk Factors include: advanced age (>72mn, >>37women);dyslipidemia;diabetes mellitus;hypertension     Objective:    There were no vitals filed for this visit. There is no height or weight on file to calculate BMI.     05/22/2022    2:22 PM 05/08/2021   10:25 AM  Advanced Directives  Does Patient Have a Medical Advance Directive? Yes Yes  Type of AParamedicof A[place]rellLiving will [place]AppalachiaLiving will  Does patient want to make changes to medical advance directive? No - Patient declined   Copy of [place] Chart? No - copy requested No - copy requested    Current Medications (verified) Outpatient Encounter Medications as of 05/22/2022  Medication Sig   aspirin-acetaminophen-caffeine (EXCEDRIN MIGRAINE) 250-250-65 MG tablet Take by mouth every 6 (six) hours as needed for headache.    atorvastatin (LIPITOR) 10 MG tablet Take 1 tablet (10 mg total) by mouth at bedtime.   cholecalciferol (VITAMIN D3) 25 MCG (1000 UNIT) tablet Take 2,000 Units by mouth daily.   escitalopram (LEXAPRO) 10 MG tablet Take 2 tablets (20 mg total) by mouth daily.   Lancets (ONETOUCH DELICA PLUS [id number] MISC USE TO CHECK BLOOD SUGARS TWO TIMES A DAY   levothyroxine (SYNTHROID) 125 MCG tablet Take 1 tablet (125 mcg total) by mouth daily before breakfast.   losartan (COZAAR) 50 MG tablet Take 1 tablet (50 mg total) by mouth  daily.   Melatonin 200 MCG TABS Take 200 mcg by mouth daily.   metFORMIN (GLUCOPHAGE-XR) 500 MG 24 hr tablet Take 1 tablet (500 mg total) by mouth 2 (two) times daily with a meal.   metoprolol tartrate (LOPRESSOR) 25 MG tablet Take 1 tablet (25 mg total) by mouth 2 (two) times daily.   ONETOUCH VERIO test strip USE TO CHECK BLOOD SUGAR TWICE DAILY AS DIRECTED   No facility-administered encounter medications on file as of 05/22/2022.    Allergies (verified) Iohexol, Wellbutrin [bupropion], and Iodinated contrast media   History: Past Medical History:  Diagnosis Date   Anxiety    [place]   Chronic kidney disease    Depression    [place]    History of chicken pox    History of kidney stones    Hyperlipidemia    Hypertension    Hypothyroidism    Insomnia    Migraine    Polio    at 18 monthsold   Rosacea    [place]yroid cancer ([place] 2013   Past Surgical History:  Procedure Laterality Date   APPENDECTOMY     BREAST SURGERY     [place]   EXTRACORPOREAL SHOCK WAVE LITHOTRIPSY  08/27/2017   TOTAL THYROIDECTOMY  05/05/2011   TUBAL LIGATION     Family History  Problem Relation Age of Onset   Diabetes Brother    Heart disease Mother  Hypertension Mother    Heart disease Father 10       Acute MI   Cancer Son    Heart disease Maternal Grandmother    Heart disease Maternal Grandfather    Heart disease Paternal Grandmother    Heart disease Paternal Grandfather    Cancer Brother    Colon cancer Neg Hx    Breast cancer Neg Hx    Social History   Socioeconomic History   Marital status: Divorced    Spouse name: Not on file   Number of children: 2   Years of education: Not on file   Highest education level: Not on file  Occupational History   Occupation: LPN, retired [id number]   Tobacco Use   Smoking status: Never   Smokeless tobacco: Never  Substance and Sexual Activity    Alcohol use: Not Currently   Drug use: Never   Sexual activity: Not Currently  Other Topics Concern   Not on file  Social History Narrative   Retired Corporate treasurer   2 sons ([place], [place])   Lives by herself in [place] Determinants of Health   Financial Resource Strain: Low Risk  (05/08/2021)   Overall Financial Resource Strain (CARDIA)    Difficulty of Paying Living Expenses: Not hard at all  Food Insecurity: No Food Insecurity (05/22/2022)   Hunger Vital Sign    Worried About Running Out of Food in the Last Year: Never true    Ran Out of Food in the Last Year: Never true  Transportation Needs: No Transportation Needs (05/22/2022)   PRAPARE - Hydrologist (Medical): No    Lack of Transportation (Non-Medical): No  Physical Activity: Insufficiently Active (05/08/2021)   Exercise Vital Sign    Days of Exercise per Week: 3 days    Minutes of Exercise per Session: 30 min  Stress: No Stress Concern Present (05/08/2021)   [place]    Feeling of Stress : Not at all  Social Connections: Socially Isolated (05/08/2021)   Social Connection and Isolation Panel [NHANES]    Frequency of Communication with Friends and Family: Three times a week    Frequency of Social Gatherings with Friends and Family: Once a week    Attends Religious Services: Never    Marine scientist or Organizations: No    Attends Music therapist: Never    Marital Status: Divorced    Tobacco Counseling Counseling given: Not Answered   Clinical Intake:  Pre-visit preparation completed: Yes  Pain : No/denies pain  How often do you need to have someone help you when you read instructions, pamphlets, or other written materials from your doctor or pharmacy?: 1 - Never  Diabetic? Nutrition Risk Assessment:  Has the patient had any N/V/D within the last 2 months?  No  Does the patient have any  non-healing wounds?  No  Has the patient had any unintentional weight loss or weight gain?  No   Diabetes:  Is the patient diabetic?  Yes  If diabetic, was a CBG obtained today?  No  Did the patient bring in their glucometer from home?  No  How often do you monitor your CBG's? Twice daily.   Financial Strains and Diabetes Management:  Are you having any financial strains with the device, your supplies or your medication? No .  Does the patient want to be seen by Chronic Care Management for management of  their diabetes?  No  Would the patient like to be referred to a Nutritionist or for Diabetic Management?  No   Diabetic Exams:  Diabetic Eye Exam: Completed 05/19/22 Diabetic Foot Exam: Completed 02/03/22   Interpreter Needed?: No  Information entered by :: Beatris Ship, [place]   Activities of Daily Living    05/22/2022    2:25 PM  In your present state of health, do you have any difficulty performing the following activities:  Hearing? 1  Vision? 1  Comment has cataracts  Difficulty concentrating or making decisions? 1  Walking or climbing stairs? 0  Dressing or bathing? 0  Doing errands, shopping? 0  Preparing Food and eating ? N  Using the Toilet? N  In the past six months, have you accidently leaked urine? Y  Comment some slight stress incontinence  Do you have problems with loss of bowel control? N  Managing your Medications? N  Managing your Finances? N  Housekeeping or managing your Housekeeping? N    Patient Care Team: Colon Branch, MD as PCP - General (Internal Medicine) Amalia Greenhouse, MD as Referring Physician (Endocrinology) Christy Sartorius, MD as Referring Physician (Urology) de Kateri Mc, OD as Consulting Physician (Optometry)  Indicate any recent Medical Services you may have received from other than [facility] providers in the past year (date may be approximate).     Assessment:   This is a routine wellness examination for Shari Drake.  Hearing/Vision  screen No results found.  Dietary issues and exercise activities discussed: Current Exercise Habits: Home exercise routine, Type of exercise: walking, Time (Minutes): 40, Frequency (Times/Week): 3, Weekly Exercise (Minutes/Week): 120, Intensity: Mild, Exercise limited by: None identified   Goals Addressed   None    Depression Screen    05/22/2022    2:24 PM 02/13/2022    2:42 PM 10/24/2021    3:11 PM 05/08/2021   10:29 AM 02/07/2021    3:48 PM 08/02/2020    2:35 PM 03/29/2020    2:12 PM  PHQ 2/9 Scores  PHQ - 2 Score 0 0 0 0 0 0 0  PHQ- 9 Score   '1  1 2 2    '$ Fall Risk    05/22/2022    2:22 PM 02/13/2022    2:42 PM 10/24/2021    2:42 PM 05/08/2021   10:27 AM 02/07/2021    3:08 PM  Fall Risk   Falls in the past year? 0 0 0 1 1  Number falls in past yr: 0 0 0 0 0  Injury with Fall? 0 0 0 0 1  Risk for fall due to : No Fall Risks      Follow up Falls evaluation completed Falls evaluation completed Falls evaluation completed Falls prevention discussed Falls evaluation completed    [place]:  Any stairs in or around the home? No  If so, are there any without handrails? No  Home free of loose throw rugs in walkways, pet beds, electrical cords, etc? Yes  Adequate lighting in your home to reduce risk of falls? Yes   ASSISTIVE DEVICES UTILIZED TO PREVENT FALLS:  Life alert? No  Use of a cane, walker or w/c? No  Grab bars in the bathroom? Yes  Shower chair or bench in shower? No  Elevated toilet seat or a handicapped toilet? Yes   TIMED UP AND GO:  Was the test performed?  No, audio visit .   Cognitive Function:  05/22/2022    2:30 PM  6CIT Screen  What Year? 0 points  What month? 0 points  What time? 0 points  Count back from 20 2 points  Months in reverse 0 points  Repeat phrase 0 points  Total Score 2 points    Immunizations Immunization History  Administered Date(s) Administered   Fluad Quad(high Dose 65+)  03/29/2020, 02/07/2021, 02/13/2022   Influenza, High Dose Seasonal PF 01/16/2016, 02/10/2017   Influenza-Unspecified 01/25/2015   PFIZER(Purple Top)SARS-COV-2 Vaccination 05/19/2019, 06/09/2019, 02/05/2020, 08/28/2020   Pfizer Covid-19 Vaccine Bivalent Booster 8yr & up 02/22/2021   Pneumococcal Conjugate-13 11/05/2017   Pneumococcal Polysaccharide-23 02/25/2011, 08/02/2020   Td 05/24/2006   Tdap 05/29/2021   Zoster Recombinat (Shingrix) 05/29/2021, 10/08/2021    TDAP status: Up to date  Flu Vaccine status: Up to date  Pneumococcal vaccine status: Up to date  Covid-19 vaccine status: Information provided on how to obtain vaccines.   Qualifies for Shingles Vaccine? Yes   Zostavax completed No   Shingrix Completed?: Yes  Screening Tests Health Maintenance  Topic Date Due   DEXA SCAN  Never done   COVID-19 Vaccine (6 - 2023-24 season) 12/26/2021   Medicare Annual Wellness (AWV)  05/08/2022   Diabetic kidney evaluation - Urine ACR  02/13/2027 (Originally 08/22/1963)   HEMOGLOBIN A1C  09/26/2022   FOOT EXAM  02/14/2023   Diabetic kidney evaluation - eGFR measurement  03/28/2023   OPHTHALMOLOGY EXAM  05/20/2023   DTaP/Tdap/Td (3 - Td or Tdap) 05/30/2031   Pneumonia Vaccine 77 Years old  Completed   INFLUENZA VACCINE  Completed   Hepatitis C Screening  Completed   Zoster Vaccines- Shingrix  Completed   HPV VACCINES  Aged Out    Health Maintenance  Health Maintenance Due  Topic Date Due   DEXA SCAN  Never done   COVID-19 Vaccine (6 - 2023-24 season) 12/26/2021   Medicare Annual Wellness (AWV)  05/08/2022    Colorectal cancer screening: No longer required.   Mammogram status: No longer required due to mastectomy.  Bone Density status: pt declined.  Lung Cancer Screening: (Low Dose CT Chest recommended if Age 79-80years, 30 pack-year currently smoking OR have quit w/in 15years.) does not qualify.   Additional Screening:  Hepatitis C Screening: does qualify;  Completed 08/02/20  Vision Screening: Recommended annual ophthalmology exams for early detection of glaucoma and other disorders of the eye. Is the patient up to date with their annual eye exam?  Yes  Who is the provider or what is the name of the office in which the patient attends annual eye exams? [facility] If pt is not established with a provider, would they like to be referred to a provider to establish care? No .   Dental Screening: Recommended annual dental exams for proper oral hygiene  Community Resource Referral / Chronic Care Management: CRR required this visit?  No   CCM required this visit?  No      Plan:     I have personally reviewed and noted the following in the patient's chart:   Medical and social history Use of alcohol, tobacco or illicit drugs  Current medications and supplements including opioid prescriptions. Patient is not currently taking opioid prescriptions. Functional ability and status Nutritional status Physical activity Advanced directives List of other physicians Hospitalizations, surgeries, and ER visits in previous 12 months Vitals Screenings to include cognitive, depression, and falls Referrals and appointments  In addition, I have reviewed and discussed with patient certain  preventive protocols, quality metrics, and best practice recommendations. A written personalized care plan for preventive services as well as general preventive health recommendations were provided to patient.   Due to this being a telephonic visit, the after visit summary with patients personalized plan was offered to patient via mail or my-chart. Per request, patient was mailed a copy of AVS.   Beatris Ship, [place]   05/22/2022   Nurse Notes: None

## 2022-05-22 NOTE — Patient Instructions (Signed)
Shari Drake , Thank you for taking time to come for your Medicare Wellness Visit. I appreciate your ongoing commitment to your health goals. Please review the following plan we discussed and let me know if I can assist you in the future.   These are the goals we discussed:  Goals      Patient Stated     Do more walking        This is a list of the screening recommended for you and due dates:  Health Maintenance  Topic Date Due   DEXA scan (bone density measurement)  Never done   COVID-19 Vaccine (6 - 2023-24 season) 12/26/2021   Yearly kidney health urinalysis for diabetes  02/13/2027*   Hemoglobin A1C  09/26/2022   Complete foot exam   02/14/2023   Yearly kidney function blood test for diabetes  03/28/2023   Eye exam for diabetics  05/20/2023   Medicare Annual Wellness Visit  05/23/2023   DTaP/Tdap/Td vaccine (3 - Td or Tdap) 05/30/2031   Pneumonia Vaccine  Completed   Flu Shot  Completed   Hepatitis C Screening: USPSTF Recommendation to screen - Ages 1-79 yo.  Completed   Zoster (Shingles) Vaccine  Completed   HPV Vaccine  Aged Out  *Topic was postponed. The date shown is not the original due date.     Next appointment: Follow up in one year for your annual wellness visit.   Preventive Care 45 Years and Older, Female Preventive care refers to lifestyle choices and visits with your health care provider that can promote health and wellness. What does preventive care include? A yearly physical exam. This is also called an annual well check. Dental exams once or twice a year. Routine eye exams. Ask your health care provider how often you should have your eyes checked. Personal lifestyle choices, including: Daily care of your teeth and gums. Regular physical activity. Eating a healthy diet. Avoiding tobacco and drug use. Limiting alcohol use. Practicing safe sex. Taking low-dose aspirin every day. Taking vitamin and mineral supplements as recommended by your health care  provider. What happens during an annual well check? The services and screenings done by your health care provider during your annual well check will depend on your age, overall health, lifestyle risk factors, and family history of disease. Counseling  Your health care provider may ask you questions about your: Alcohol use. Tobacco use. Drug use. Emotional well-being. Home and relationship well-being. Sexual activity. Eating habits. History of falls. Memory and ability to understand (cognition). Work and work Statistician. Reproductive health. Screening  You may have the following tests or measurements: Height, weight, and BMI. Blood pressure. Lipid and cholesterol levels. These may be checked every 5 years, or more frequently if you are over 30 years old. Skin check. Lung cancer screening. You may have this screening every year starting at age 21 if you have a 30-pack-year history of smoking and currently smoke or have quit within the past 15 years. Fecal occult blood test (FOBT) of the stool. You may have this test every year starting at age 73. Flexible sigmoidoscopy or colonoscopy. You may have a sigmoidoscopy every 5 years or a colonoscopy every 10 years starting at age 57. Hepatitis C blood test. Hepatitis B blood test. Sexually transmitted disease (STD) testing. Diabetes screening. This is done by checking your blood sugar (glucose) after you have not eaten for a while (fasting). You may have this done every 1-3 years. Bone density scan. This is done to  screen for osteoporosis. You may have this done starting at age 42. Mammogram. This may be done every 1-2 years. Talk to your health care provider about how often you should have regular mammograms. Talk with your health care provider about your test results, treatment options, and if necessary, the need for more tests. Vaccines  Your health care provider may recommend certain vaccines, such as: Influenza vaccine. This is  recommended every year. Tetanus, diphtheria, and acellular pertussis (Tdap, Td) vaccine. You may need a Td booster every 10 years. Zoster vaccine. You may need this after age 82. Pneumococcal 13-valent conjugate (PCV13) vaccine. One dose is recommended after age 20. Pneumococcal polysaccharide (PPSV23) vaccine. One dose is recommended after age 13. Talk to your health care provider about which screenings and vaccines you need and how often you need them. This information is not intended to replace advice given to you by your health care provider. Make sure you discuss any questions you have with your health care provider. Document Released: 05/10/2015 Document Revised: 01/01/2016 Document Reviewed: 02/12/2015 Elsevier Interactive Patient Education  2017 [place] Prevention in the Home Falls can cause injuries. They can happen to people of all ages. There are many things you can do to make your home safe and to help prevent falls. What can I do on the outside of my home? Regularly fix the edges of walkways and driveways and fix any cracks. Remove anything that might make you trip as you walk through a door, such as a raised step or threshold. Trim any bushes or trees on the path to your home. Use bright outdoor lighting. Clear any walking paths of anything that might make someone trip, such as rocks or tools. Regularly check to see if handrails are loose or broken. Make sure that both sides of any steps have handrails. Any raised decks and porches should have guardrails on the edges. Have any leaves, snow, or ice cleared regularly. Use sand or salt on walking paths during winter. Clean up any spills in your garage right away. This includes oil or grease spills. What can I do in the bathroom? Use night lights. Install grab bars by the toilet and in the tub and shower. Do not use towel bars as grab bars. Use non-skid mats or decals in the tub or shower. If you need to sit down in  the shower, use a plastic, non-slip stool. Keep the floor dry. Clean up any water that spills on the floor as soon as it happens. Remove soap buildup in the tub or shower regularly. Attach bath mats securely with double-sided non-slip rug tape. Do not have throw rugs and other things on the floor that can make you trip. What can I do in the bedroom? Use night lights. Make sure that you have a light by your bed that is easy to reach. Do not use any sheets or blankets that are too big for your bed. They should not hang down onto the floor. Have a firm chair that has side arms. You can use this for support while you get dressed. Do not have throw rugs and other things on the floor that can make you trip. What can I do in the kitchen? Clean up any spills right away. Avoid walking on wet floors. Keep items that you use a lot in easy-to-reach places. If you need to reach something above you, use a strong step stool that has a grab bar. Keep electrical cords out of the way.  Do not use floor polish or wax that makes floors slippery. If you must use wax, use non-skid floor wax. Do not have throw rugs and other things on the floor that can make you trip. What can I do with my stairs? Do not leave any items on the stairs. Make sure that there are handrails on both sides of the stairs and use them. Fix handrails that are broken or loose. Make sure that handrails are as long as the stairways. Check any carpeting to make sure that it is firmly attached to the stairs. Fix any carpet that is loose or worn. Avoid having throw rugs at the top or bottom of the stairs. If you do have throw rugs, attach them to the floor with carpet tape. Make sure that you have a light switch at the top of the stairs and the bottom of the stairs. If you do not have them, ask someone to add them for you. What else can I do to help prevent falls? Wear shoes that: Do not have high heels. Have rubber bottoms. Are comfortable  and fit you well. Are closed at the toe. Do not wear sandals. If you use a stepladder: Make sure that it is fully opened. Do not climb a closed stepladder. Make sure that both sides of the stepladder are locked into place. Ask someone to hold it for you, if possible. Clearly mark and make sure that you can see: Any grab bars or handrails. First and last steps. Where the edge of each step is. Use tools that help you move around (mobility aids) if they are needed. These include: Canes. Walkers. Scooters. Crutches. Turn on the lights when you go into a dark area. Replace any light bulbs as soon as they burn out. Set up your furniture so you have a clear path. Avoid moving your furniture around. If any of your floors are uneven, fix them. If there are any pets around you, be aware of where they are. Review your medicines with your doctor. Some medicines can make you feel dizzy. This can increase your chance of falling. Ask your doctor what other things that you can do to help prevent falls. This information is not intended to replace advice given to you by your health care provider. Make sure you discuss any questions you have with your health care provider. Document Released: 02/07/2009 Document Revised: 09/19/2015 Document Reviewed: 05/18/2014 Elsevier Interactive Patient Education  2017 [employer].

## 2022-06-24 DIAGNOSIS — E119 Type 2 diabetes mellitus without complications: Secondary | ICD-10-CM | POA: Diagnosis not present

## 2022-06-24 DIAGNOSIS — H2511 Age-related nuclear cataract, right eye: Secondary | ICD-10-CM | POA: Diagnosis not present

## 2022-06-24 DIAGNOSIS — H2513 Age-related nuclear cataract, bilateral: Secondary | ICD-10-CM | POA: Diagnosis not present

## 2022-06-24 LAB — HM DIABETES EYE EXAM

## 2022-07-17 ENCOUNTER — Other Ambulatory Visit: Payer: Self-pay | Admitting: Internal Medicine

## 2022-07-30 ENCOUNTER — Other Ambulatory Visit: Payer: Self-pay | Admitting: Internal Medicine

## 2022-08-28 ENCOUNTER — Encounter: Payer: Medicare Other | Admitting: Internal Medicine

## 2022-09-22 DIAGNOSIS — H2511 Age-related nuclear cataract, right eye: Secondary | ICD-10-CM | POA: Diagnosis not present

## 2022-09-23 DIAGNOSIS — H2512 Age-related nuclear cataract, left eye: Secondary | ICD-10-CM | POA: Diagnosis not present

## 2022-09-23 LAB — HM DIABETES EYE EXAM

## 2022-10-13 ENCOUNTER — Other Ambulatory Visit: Payer: Self-pay | Admitting: Internal Medicine

## 2022-10-20 DIAGNOSIS — H2512 Age-related nuclear cataract, left eye: Secondary | ICD-10-CM | POA: Diagnosis not present

## 2022-10-21 LAB — HM DIABETES EYE EXAM

## 2022-12-11 ENCOUNTER — Encounter: Payer: Medicare Other | Admitting: Internal Medicine

## 2023-01-01 ENCOUNTER — Encounter: Payer: Medicare Other | Admitting: Internal Medicine

## 2023-01-06 ENCOUNTER — Other Ambulatory Visit: Payer: Self-pay | Admitting: Internal Medicine

## 2023-02-26 ENCOUNTER — Encounter: Payer: Self-pay | Admitting: Internal Medicine

## 2023-02-26 ENCOUNTER — Ambulatory Visit (INDEPENDENT_AMBULATORY_CARE_PROVIDER_SITE_OTHER): Payer: Medicare Other | Admitting: Internal Medicine

## 2023-02-26 VITALS — BP 126/80 | HR 67 | Temp 98.2°F | Resp 16 | Ht 64.0 in | Wt 173.4 lb

## 2023-02-26 DIAGNOSIS — E119 Type 2 diabetes mellitus without complications: Secondary | ICD-10-CM | POA: Diagnosis not present

## 2023-02-26 DIAGNOSIS — I10 Essential (primary) hypertension: Secondary | ICD-10-CM | POA: Diagnosis not present

## 2023-02-26 DIAGNOSIS — Z7984 Long term (current) use of oral hypoglycemic drugs: Secondary | ICD-10-CM

## 2023-02-26 DIAGNOSIS — Z0001 Encounter for general adult medical examination with abnormal findings: Secondary | ICD-10-CM

## 2023-02-26 DIAGNOSIS — E039 Hypothyroidism, unspecified: Secondary | ICD-10-CM | POA: Diagnosis not present

## 2023-02-26 DIAGNOSIS — Z23 Encounter for immunization: Secondary | ICD-10-CM

## 2023-02-26 DIAGNOSIS — Z0000 Encounter for general adult medical examination without abnormal findings: Secondary | ICD-10-CM | POA: Diagnosis not present

## 2023-02-26 DIAGNOSIS — E785 Hyperlipidemia, unspecified: Secondary | ICD-10-CM

## 2023-02-26 DIAGNOSIS — E559 Vitamin D deficiency, unspecified: Secondary | ICD-10-CM | POA: Diagnosis not present

## 2023-02-26 NOTE — Patient Instructions (Addendum)
Vaccines I recommend: Covid booster RSV vaccine   Check the  blood pressure regularly Blood pressure goal:  between 110/65 and  135/85. If it is consistently higher or lower, let me know   Diabetes: You can check your sugars at different times, they right times to do it are: - early in AM fasting  ( blood sugar goal 70-130) - 2 hours after a meal (blood sugar goal less than 180)     GO TO THE LAB : Get the blood work     Next visit with me in 6 months for a routine checkup     Please schedule it at the front desk     "Health Care Power of attorney" ,  "Living will" (Advance care planning documents)  If you already have a living will or healthcare power of attorney, is recommended you bring the copy to be scanned in your chart.   The document will be available to all the doctors you see in the system.  Advance care planning is a process that supports adults in  understanding and sharing their preferences regarding future medical care.  The patient's preferences are recorded in documents called Advance Directives and the can be modified at any time while the patient is in full mental capacity.   If you don't have one, please consider create one.      More information at: [email or web address]

## 2023-02-26 NOTE — Progress Notes (Unsigned)
Subjective:    Patient ID: Shari Drake, female    DOB: 1946-02-07, 77 y.o.   MRN: [id number]  DOS:  02/26/2023 Type of visit - description: CPX For CPX Chronic medical problems addressed. In general feels well. Ambulatory BPs and CBGs essentially normal. 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   Rosacea    Thyroid cancer (HCC) 2013    Past Surgical History:  Procedure Laterality Date   APPENDECTOMY     BREAST SURGERY     masectomy- L and R   CESAREAN SECTION     1970, 1973   CHOLECYSTECTOMY  09/30/1995   EXTRACORPOREAL SHOCK WAVE LITHOTRIPSY  08/27/2017   TOTAL THYROIDECTOMY  05/05/2011   TUBAL LIGATION      Current Outpatient Medications  Medication Instructions   aspirin-acetaminophen-caffeine (EXCEDRIN MIGRAINE) 250-250-65 MG tablet Oral, Every 6 hours PRN   atorvastatin (LIPITOR) 10 mg, Oral, Daily at bedtime   cholecalciferol (VITAMIN D3) 2,000 Units, Oral, Daily   escitalopram (LEXAPRO) 20 mg, Oral, Daily   glucose blood (ONETOUCH VERIO) test strip USE TO CHECK BLOOD SUGAR TWICE DAILY AS DIRECTED   Lancets (ONETOUCH DELICA PLUS LANCET33G) MISC USE TO CHECK BLOOD SUGARS TWO TIMES A DAY   levothyroxine (SYNTHROID) 125 mcg, Oral, Daily before breakfast   losartan (COZAAR) 50 mg, Oral, Daily   Melatonin 200 mcg, Oral, Daily   metFORMIN (GLUCOPHAGE-XR) 500 mg, Oral, 2 times daily with meals   metoprolol tartrate (LOPRESSOR) 25 mg, Oral, 2 times daily       Objective:   Physical Exam BP 126/80   Pulse 67   Temp 98.2 F (36.8 C) (Oral)   Resp 16   Ht 5\' 4"  (1.626 m)   Wt 173 lb 6 oz (78.6 kg)   SpO2 97%    BMI 29.76 kg/m  General: Well developed, NAD, BMI noted Neck: No mass noted, history of total thyroidectomy HEENT:  Normocephalic . Face symmetric, atraumatic Lungs:  CTA B Normal respiratory effort, no intercostal retractions, no accessory muscle use. Heart: RRR,  no murmur.  Abdomen:  Not distended, soft, non-tender. No rebound or rigidity.   DM foot exam: No edema, good pedal pulses, pinprick examination normal Skin: Exposed areas without rash. Not pale. Not jaundice Neurologic:  alert & oriented X3.  Speech normal, gait appropriate for age and unassisted Strength symmetric and appropriate for age.  Psych: Cognition and judgment appear intact.  Cooperative with normal attention span and concentration.  Behavior appropriate. No anxious or depressed appearing.     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 No further cervical ca screening  H/o Breast CA, s/p B mastectomy.  Last  seen by oncology 08/22/2020, follow-up as needed  Dexa: last ~ 2016, no on Ca and Vit D, rec to start vit D, DEXA ordered 2022, failed, not interested in checking CCS: Never had a colonoscopy,  previous Cologuard referral failed, aged out for screening.  Labs:CMP FLP CBC A1c TSH [facility] POA: See AVS. DM: On metformin, ambulatory CBGs in the 120s.  Checking labs. HTN: BP today is very good, continue losartan, metoprolol.  Checking CMP and CBC. High cholesterol: On atorvastatin.  Labs. Hypothyroidism: Good compliance with Synthroid, check TSH Depression: Reports some stress related to taking care of her ex-husband who has dementia but overall doing okay, on  Lexapro, RF as needed. History of breast cancer: Saw oncology 08/22/2020, no suspicious lumps felt at the right mastectomy site, [place] was ordered. F/u rec prn RTC 6 months.

## 2023-02-27 LAB — LIPID PANEL
Cholesterol: 97 mg/dL (ref <200)
HDL: 42 mg/dL — ABNORMAL LOW (ref >=50)
LDL Cholesterol (Calc): 35 mg/dL
Non-HDL Cholesterol (Calc): 55 mg/dL (ref <130)
Total CHOL/HDL Ratio: 2.3 (calc) (ref <5.0)
Triglycerides: 112 mg/dL (ref <150)

## 2023-02-27 LAB — COMPREHENSIVE METABOLIC PANEL
AG Ratio: 1.9 (calc) (ref 1.0–2.5)
ALT: 14 U/L (ref 6–29)
AST: 17 U/L (ref 10–35)
Albumin: 4.2 g/dL (ref 3.6–5.1)
Alkaline phosphatase (APISO): 59 U/L (ref 37–153)
BUN: 17 mg/dL (ref 7–25)
CO2: 28 mmol/L (ref 20–32)
Calcium: 10.6 mg/dL — ABNORMAL HIGH (ref 8.6–10.4)
Chloride: 102 mmol/L (ref 98–110)
Creat: 0.77 mg/dL (ref 0.60–1.00)
Globulin: 2.2 g/dL (ref 1.9–3.7)
Glucose, Bld: 158 mg/dL — ABNORMAL HIGH (ref 65–99)
Potassium: 4.6 mmol/L (ref 3.5–5.3)
Sodium: 139 mmol/L (ref 135–146)
Total Bilirubin: 0.8 mg/dL (ref 0.2–1.2)
Total Protein: 6.4 g/dL (ref 6.1–8.1)

## 2023-02-27 LAB — MICROALBUMIN / CREATININE URINE RATIO
Creatinine, Urine: 101 mg/dL (ref 20–275)
Microalb, Ur: <0.2 mg/dL

## 2023-02-27 LAB — CBC WITH DIFFERENTIAL/PLATELET
Absolute Lymphocytes: 3094 {cells}/uL (ref 850–3900)
Absolute Monocytes: 543 {cells}/uL (ref 200–950)
Basophils Absolute: 41 {cells}/uL (ref 0–200)
Basophils Relative: 0.5 %
Eosinophils Absolute: 211 {cells}/uL (ref 15–500)
Eosinophils Relative: 2.6 %
HCT: 41.6 % (ref 35.0–45.0)
Hemoglobin: 14.1 g/dL (ref 11.7–15.5)
MCH: 30.7 pg (ref 27.0–33.0)
MCHC: 33.9 g/dL (ref 32.0–36.0)
MCV: 90.6 fL (ref 80.0–100.0)
MPV: 10.7 fL (ref 7.5–12.5)
Monocytes Relative: 6.7 %
Neutro Abs: 4212 {cells}/uL (ref 1500–7800)
Neutrophils Relative %: 52 %
Platelets: 313 10*3/uL (ref 140–400)
RBC: 4.59 10*6/uL (ref 3.80–5.10)
RDW: 12.1 % (ref 11.0–15.0)
Total Lymphocyte: 38.2 %
WBC: 8.1 10*3/uL (ref 3.8–10.8)

## 2023-02-27 LAB — VITAMIN D 25 HYDROXY (VIT D DEFICIENCY, FRACTURES): Vit D, 25-Hydroxy: 73 ng/mL (ref 30–100)

## 2023-02-27 LAB — TSH: TSH: 0.68 m[IU]/L (ref 0.40–4.50)

## 2023-02-27 LAB — HEMOGLOBIN A1C
Hgb A1c MFr Bld: 7 %{Hb} — ABNORMAL HIGH (ref <5.7)
Mean Plasma Glucose: 154 mg/dL
eAG (mmol/L): 8.5 mmol/L

## 2023-02-28 ENCOUNTER — Encounter: Payer: Self-pay | Admitting: Internal Medicine

## 2023-02-28 NOTE — Assessment & Plan Note (Signed)
Here for CPX DM: On metformin, ambulatory CBGs in the 120s.  Checking labs. HTN: BP today is very good, continue losartan, metoprolol.  Checking CMP and CBC. High cholesterol: On atorvastatin.  Labs. Hypothyroidism: Good compliance with Synthroid, check TSH Depression: Reports some stress related to taking care of her ex-husband who has dementia but overall doing okay, on Lexapro, RF as needed. History of breast cancer: Saw oncology 08/22/2020, no suspicious lumps felt at the right mastectomy site, [place] was ordered. F/u rec prn RTC 6 months.

## 2023-02-28 NOTE — Assessment & Plan Note (Signed)
Here for CPX Tdap: 2023 PNM 13:  2019; PMN 23: 2012,2022 S/p shingrix Flu shot today Recommend: RSV, COVID booster. No further cervical ca screening  H/o Breast CA, s/p B mastectomy.  Last seen by oncology 08/22/2020, follow-up as needed Dexa: last ~ 2016, no on Ca and Vit D, rec to start vit D, DEXA ordered 2022, failed, not interested in checking CCS: Never had a colonoscopy,  previous Cologuard referral failed, aged out for screening.  Labs:CMP FLP CBC A1c TSH [facility] POA: See AVS.

## 2023-03-10 ENCOUNTER — Telehealth: Payer: Self-pay | Admitting: Internal Medicine

## 2023-03-10 MED ORDER — ONETOUCH DELICA PLUS LANCET33G MISC: 12 refills | Status: DC

## 2023-03-10 MED ORDER — ONETOUCH VERIO VI STRP: ORAL_STRIP | 12 refills | Status: DC

## 2023-03-10 NOTE — Telephone Encounter (Signed)
New rx for test strips and lancets sent.

## 2023-03-10 NOTE — Telephone Encounter (Signed)
Pt states for ins purposes she needs her one touch directions to indicate she checks sugar 2-4 times a day. She is running out of the pricks too often and ins is not covering it. Also states she is due for a refill of pricks.    [id number] - [place], [place] - [address], [place] [address] Phone: [phone]  Fax: [phone]

## 2023-03-31 ENCOUNTER — Other Ambulatory Visit: Payer: Self-pay | Admitting: Internal Medicine

## 2023-04-06 ENCOUNTER — Other Ambulatory Visit: Payer: Self-pay | Admitting: Internal Medicine

## 2023-04-09 ENCOUNTER — Telehealth: Payer: Self-pay | Admitting: Internal Medicine

## 2023-04-09 NOTE — Telephone Encounter (Signed)
Please advise 

## 2023-04-09 NOTE — Telephone Encounter (Signed)
Spoke w/ Pt- informed of PCP recommendations. Pt verbalized understanding.  

## 2023-04-09 NOTE — Telephone Encounter (Signed)
Recommend to avoid Sudafed. Recommend instead: Flonase 2 sprays on each side of the nose daily. Robitussin if needed Tylenol if needed

## 2023-04-09 NOTE — Telephone Encounter (Signed)
Pt states he is having some sinus issues and wanted to know if she could take sudafed otc. Please advise.

## 2023-06-07 ENCOUNTER — Telehealth: Payer: Self-pay

## 2023-06-07 NOTE — Telephone Encounter (Signed)
Spoke w/ Pt- informed of PCP recommendations. Pt verbalized understanding.  

## 2023-06-07 NOTE — Telephone Encounter (Signed)
 Advise patient: Recommend to take metformin  twice daily without skipping

## 2023-06-07 NOTE — Telephone Encounter (Signed)
 Copied from CRM [id number]. Topic: Clinical - Medication Question >> Jun 07, 2023  9:30 AM Juvenal Opoka wrote: Reason for CRM: Patient is wondering is it okay for a new prescription to be written for  metFORMIN  (GLUCOPHAGE -XR) 500 MG 24 hr tablet regarding taking an extra dosage of medication at night, states she only takes one extra tablet when blood sugar is over 200 using it to lower sugar and pharmacy would like an prescription updated if possible and okayed by primary care. If okay please reach out to patient to update at [phone]

## 2023-06-10 ENCOUNTER — Telehealth: Payer: Self-pay | Admitting: Internal Medicine

## 2023-06-10 ENCOUNTER — Other Ambulatory Visit: Payer: Self-pay

## 2023-06-10 MED ORDER — METFORMIN HCL ER 500 MG PO TB24: 500.0000 mg | ORAL_TABLET | Freq: Two times a day (BID) | ORAL | 1 refills | Status: DC

## 2023-06-10 NOTE — Telephone Encounter (Signed)
Medication appears to have already been refilled by office. Routing conversation due to patient request for an update from office.

## 2023-06-10 NOTE — Telephone Encounter (Signed)
Spoke w/ Pt- informed of call on 06/07/23 that we had, she continued to take metformin xr 500mg  2 tablets twice daily (this is not what was documented that she was doing on the call from 06/07/23). She will be out of metformin on Sunday-pharmacy unable to refill because she has been taking more than prescribed. Instructed she should call her ins and explain the situation- they maybe able to give a 1 time override so she doesn't run out- she refused to do this. She then explained that her blood sugars are still over 200, 5-6 times monthly even with metformin xr 500mg  2 tablets twice daily. I then recommended she schedule an in person visit to discuss diabetes medication management with Dr. Drue Novel- I offered her an appt for Monday 2/17- she tells me she is unsure if that will work for her- she will have to talk w/ her daughter-in-law. She is adamant about having the metformin prescription updated to 2 tablets twice daily, I again explained that it sounds like that isn't working to keep her blood sugars down and again tried to get her to schedule an appt. She got frustrated and stated "I'll just talk with you later" and disconnected the call.

## 2023-06-10 NOTE — Telephone Encounter (Signed)
Copied from CRM [id number]. Topic: Clinical - Medication Refill >> Jun 10, 2023  9:43 AM [place] J wrote: Most Recent Primary Care Visit:  Provider: Willow Ora E  Department: [facility]  Visit Type: PHYSICAL  Date: 02/26/2023  Medication: metFORMIN (GLUCOPHAGE-XR) 500 MG 24  Has the patient contacted their pharmacy? Yes (Agent: If no, request that the patient contact the pharmacy for the refill. If patient does not wish to contact the pharmacy document the reason why and proceed with request.) (Agent: If yes, when and what did the pharmacy advise?)  Is this the correct pharmacy for this prescription? Yes If no, delete pharmacy and type the correct one.  This is the patient's preferred pharmacy:  [facility] [id number] - [place], [place] - [address] [place] [address] Phone: [phone] Fax: [phone]   Has the prescription been filled recently? No  Is the patient out of the medication? Yes  Has the patient been seen for an appointment in the last year OR does the patient have an upcoming appointment? Yes  Can we respond through MyChart? Yes  Agent: Please be advised that Rx refills may take up to 3 business days. We ask that you follow-up with your pharmacy.

## 2023-06-10 NOTE — Telephone Encounter (Signed)
Pt was informed she needed appt- she states she will need to discuss with her daughter-in-law.

## 2023-06-10 NOTE — Telephone Encounter (Signed)
Last A1c was 7.0 on metformin XR 500 mg 1 tablet twice daily.  Needs office visit for DM management. Recommend check CBGs  in the morning and  2 hours after a meal  bring a CBG log to be discussed at the OV

## 2023-07-03 ENCOUNTER — Other Ambulatory Visit: Payer: Self-pay | Admitting: Internal Medicine

## 2023-08-05 ENCOUNTER — Telehealth: Payer: Self-pay | Admitting: Internal Medicine

## 2023-08-05 NOTE — Telephone Encounter (Signed)
 Copied from CRM [id number]. Topic: Medicare AWV >> Aug 05, 2023 11:19 AM Payton Doughty wrote: Reason for CRM: Called 08/05/2023 to sched AWV - NO VOICEMAIL  Verlee Rossetti; Care Guide Ambulatory Clinical Support [place] [facility] Direct Dial: [phone]

## 2023-09-04 ENCOUNTER — Other Ambulatory Visit: Payer: Self-pay | Admitting: Internal Medicine

## 2023-09-17 ENCOUNTER — Ambulatory Visit: Payer: Medicare Other | Admitting: Internal Medicine

## 2023-09-21 ENCOUNTER — Encounter: Payer: Self-pay | Admitting: Internal Medicine

## 2023-09-21 ENCOUNTER — Ambulatory Visit (INDEPENDENT_AMBULATORY_CARE_PROVIDER_SITE_OTHER): Admitting: Internal Medicine

## 2023-09-21 VITALS — BP 126/70 | HR 62 | Temp 97.9°F | Resp 16 | Ht 64.0 in | Wt 179.1 lb

## 2023-09-21 DIAGNOSIS — F32A Depression, unspecified: Secondary | ICD-10-CM

## 2023-09-21 DIAGNOSIS — E119 Type 2 diabetes mellitus without complications: Secondary | ICD-10-CM | POA: Diagnosis not present

## 2023-09-21 DIAGNOSIS — Z7984 Long term (current) use of oral hypoglycemic drugs: Secondary | ICD-10-CM

## 2023-09-21 DIAGNOSIS — E039 Hypothyroidism, unspecified: Secondary | ICD-10-CM

## 2023-09-21 DIAGNOSIS — I10 Essential (primary) hypertension: Secondary | ICD-10-CM

## 2023-09-21 DIAGNOSIS — F419 Anxiety disorder, unspecified: Secondary | ICD-10-CM | POA: Diagnosis not present

## 2023-09-21 MED ORDER — FREESTYLE LIBRE 3 PLUS SENSOR MISC: 3 refills | Status: DC

## 2023-09-21 MED ORDER — FREESTYLE LIBRE 3 READER DEVI: 0 refills | Status: DC

## 2023-09-21 NOTE — Patient Instructions (Signed)
 We are sending a continuous glucose monitor.  Your blood sugar goals are as follows: - early in AM fasting  ( blood sugar goal 70-130) - 2 hours after a meal (blood sugar goal less than 180) - bedtime (goal 90-150)   Check the  blood pressure regularly Blood pressure goal:  between 110/65 and  135/85. If it is consistently higher or lower, let me know     GO TO THE LAB :  Get the blood work   Your results will be posted on MyChart with my comments  Next office visit for a physical exam by 02/2024 sooner if needed Please make an appointment before you leave today

## 2023-09-21 NOTE — Progress Notes (Signed)
 Subjective:    Patient ID: Shari Drake, female    DOB: [birth year], 78 y.o.   MRN: [id number]  DOS:  09/21/2023 Type of visit - description: ROV  Routine checkup. Chronic medical problems addressed. Her main concern is stress, she is taking care of her ex-husband, he has memory issues, at the end of the day she feels exhausted.  Ambulatory CBGs typically less than 180.  If they are in the high side, she takes extra metformin . Room for improvement on diet.  Wt Readings from Last 3 Encounters:  09/21/23 179 lb 2 oz (81.3 kg)  02/26/23 173 lb 6 oz (78.6 kg)  02/13/22 173 lb 4 oz (78.6 kg)     Review of Systems See above   Past Medical History:  Diagnosis Date   Anxiety    Breast cancer (HCC) 11/2010   Chronic kidney disease    Depression    Diabetes (HCC)    History of chicken pox    History of kidney stones    Hyperlipidemia    Hypertension    Hypothyroidism    Insomnia    Migraine    Polio    at 30 months old   Rosacea    Thyroid  cancer (HCC) 2013    Past Surgical History:  Procedure Laterality Date   APPENDECTOMY     BREAST SURGERY     masectomy- L and R   CESAREAN SECTION     1970, 1973   CHOLECYSTECTOMY  09/30/1995   EXTRACORPOREAL SHOCK WAVE LITHOTRIPSY  08/27/2017   TOTAL THYROIDECTOMY  05/05/2011   TUBAL LIGATION      Current Outpatient Medications  Medication Instructions   aspirin-acetaminophen-caffeine (EXCEDRIN MIGRAINE) 250-250-65 MG tablet Every 6 hours PRN   atorvastatin  (LIPITOR) 10 mg, Oral, Daily at bedtime   cholecalciferol (VITAMIN D3) 2,000 Units, Daily   escitalopram  (LEXAPRO ) 20 mg, Oral, Daily   glucose blood (ONETOUCH VERIO) test strip USE TO CHECK BLOOD SUGARS UP TO 3 TIMES DAILY   Lancets (ONETOUCH DELICA PLUS LANCET33G) MISC USE TO CHECK BLOOD SUGARS UP TO THREE TIMES DAILY   levothyroxine  (SYNTHROID ) 125 mcg, Oral, Daily before breakfast   losartan  (COZAAR ) 50 mg, Oral, Daily   Melatonin 200 mcg, Daily   metFORMIN   (GLUCOPHAGE -XR) 500 mg, Oral, 2 times daily with meals   metoprolol  tartrate (LOPRESSOR ) 25 mg, Oral, 2 times daily       Objective:   Physical Exam BP 126/70   Pulse 62   Temp 97.9 F (36.6 C) (Oral)   Resp 16   Ht 5\' 4"  (1.626 m)   Wt 179 lb 2 oz (81.3 kg)   SpO2 97%   BMI 30.75 kg/m  General:   Well developed, NAD, BMI noted. HEENT:  Normocephalic . Face symmetric, atraumatic Lungs:  CTA B Normal respiratory effort, no intercostal retractions, no accessory muscle use. Heart: RRR,  no murmur.  Lower extremities: no pretibial edema bilaterally  Skin: Not pale. Not jaundice Neurologic:  alert & oriented X3.  Speech normal, gait appropriate for age and unassisted Psych--  Cognition and judgment appear intact.  Cooperative with normal attention span and concentration.  Behavior appropriate. Seems moderately stressed but not depressed.     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  Admits to room for improvement on her diet. On metformin  XL 500 mg twice daily, takes a extra metformin  in the rare occasion her CBG reaches to 180. Request a CGM, would facilitate her care as she is very busy taking care of her ex-husband.  CGM RX  sent, CBGs goals provided.  Check A1c, consider increase metformin  dose. HTN: BP is very good today, on losartan , metoprolol , check BMP Hypothyroidism: Last TSH 0.8, continue Synthroid , check TSH Depression: On Lexapro  10 mg 2 tablets daily, + stress related to being the caregiver for her ex-husband.  Listening therapy provided, consider adjust her medication. RTC 6 months CPX

## 2023-09-21 NOTE — Assessment & Plan Note (Signed)
 DM: On metformin , last A1c increased to 7.0.  Admits to room for improvement on her diet. On metformin  XL 500 mg twice daily, takes a extra metformin  in the rare occasion her CBG reaches to 180. Request a CGM, would facilitate her care as she is very busy taking care of her ex-husband.  CGM RX  sent, CBGs goals provided.  Check A1c, consider increase metformin  dose. HTN: BP is very good today, on losartan , metoprolol , check BMP Hypothyroidism: Last TSH 0.8, continue Synthroid , check TSH Depression: On Lexapro  10 mg 2 tablets daily, + stress related to being the caregiver for her ex-husband.  Listening therapy provided, consider adjust her medication. RTC 6 months CPX

## 2023-09-22 ENCOUNTER — Ambulatory Visit: Payer: Self-pay | Admitting: Internal Medicine

## 2023-09-22 LAB — BASIC METABOLIC PANEL WITH GFR
BUN: 16 mg/dL (ref 6–23)
CO2: 30 meq/L (ref 19–32)
Calcium: 10.1 mg/dL (ref 8.4–10.5)
Chloride: 102 meq/L (ref 96–112)
Creatinine, Ser: 0.85 mg/dL (ref 0.40–1.20)
GFR: 64.86 mL/min (ref >60.00)
Glucose, Bld: 141 mg/dL — ABNORMAL HIGH (ref 70–99)
Potassium: 4.2 meq/L (ref 3.5–5.1)
Sodium: 140 meq/L (ref 135–145)

## 2023-09-22 LAB — TSH: TSH: 0.46 u[IU]/mL (ref 0.35–5.50)

## 2023-09-22 LAB — HEMOGLOBIN A1C: Hgb A1c MFr Bld: 7.1 % — ABNORMAL HIGH (ref 4.6–6.5)

## 2023-09-23 ENCOUNTER — Other Ambulatory Visit (HOSPITAL_COMMUNITY): Payer: Self-pay

## 2023-09-23 ENCOUNTER — Telehealth: Payer: Self-pay

## 2023-09-23 NOTE — Telephone Encounter (Signed)
 Pharmacy Patient Advocate Encounter   Received notification from Onbase that prior authorization for FreeStyle Libre 3 Plus Sensor is required/requested.   Insurance verification completed.   The patient is insured through [employer] .   Per test claim: PA required; PA submitted to above mentioned insurance via CoverMyMeds Key/confirmation #/EOC [id number] Status is pending

## 2023-09-29 NOTE — Telephone Encounter (Signed)
 Pharmacy Patient Advocate Encounter  Received notification from [facility] that Prior Authorization for FreeStyle Libre 3 Plus Sensor has been DENIED.  See denial reason below. No denial letter attached in CMM. Will attach denial letter to Media tab once received.   PA #/Case ID/Reference #: [id number]

## 2023-10-04 ENCOUNTER — Other Ambulatory Visit (HOSPITAL_COMMUNITY): Payer: Self-pay

## 2023-10-04 ENCOUNTER — Telehealth: Payer: Self-pay | Admitting: Internal Medicine

## 2023-10-04 NOTE — Telephone Encounter (Signed)
 Copied from CRM [id number]. Topic: Clinical - Medication Prior Auth >> Oct 01, 2023  5:07 PM Shereese L wrote: Reason for CRM:as per Caretha Chapel from [place] patient needs a prior [place] for  Continuous Glucose Sensor (FREESTYLE LIBRE 3 PLUS SENSOR) MISC... The prior 1st prior auth sent was incorrect and must have permission of medical records  Treated with insulin Document that she has level 2 or 3 with multiple attempts to adjust medication

## 2023-10-07 ENCOUNTER — Telehealth: Payer: Self-pay

## 2023-10-07 ENCOUNTER — Other Ambulatory Visit (HOSPITAL_COMMUNITY): Payer: Self-pay

## 2023-10-07 NOTE — Telephone Encounter (Signed)
   Placed a call to [facility] pharmacy and per the pharmacist he tried to bill to Medicare Part B and it rejected saying prior authorization is needed with billing pilot.   Could not find where the patient is on insulin or hypoglycemic events.

## 2023-10-08 NOTE — Telephone Encounter (Signed)
 Per the CMS website, Medicare Part B covers CGMs, supplies and accessories for all diabetes patients who have, 1) a CGM prescribed by following its FDA indications for use, 2) is insulin treated or has a history of problematic hypoglycemia and 3) a visit with the doctor 3 moths before ordering the CGM to evaluate diabetes control and confirm the above criteria  After checking over her chart, I could not find info to support the criteria.

## 2023-10-22 ENCOUNTER — Ambulatory Visit

## 2023-11-03 ENCOUNTER — Other Ambulatory Visit: Payer: Self-pay | Admitting: Internal Medicine

## 2023-11-05 ENCOUNTER — Telehealth: Payer: Self-pay

## 2023-11-05 NOTE — Telephone Encounter (Signed)
 Copied from CRM [id number]. Topic: Clinical - Medication Question >> Nov 05, 2023  4:58 PM Lavanda D wrote: Reason for CRM: Todd with [employer] called to verify that her insurance changed and needs a new order for the one touch verio. Insurance does not cover [employer]

## 2023-11-08 ENCOUNTER — Other Ambulatory Visit: Payer: Self-pay | Admitting: Internal Medicine

## 2023-11-08 MED ORDER — ONETOUCH VERIO VI STRP: ORAL_STRIP | 12 refills | Status: DC

## 2023-11-08 NOTE — Telephone Encounter (Signed)
 Rx sent.

## 2023-11-15 ENCOUNTER — Telehealth: Payer: Self-pay | Admitting: Internal Medicine

## 2023-11-15 NOTE — Telephone Encounter (Signed)
 Copied from CRM [id number]. Topic: Medicare AWV >> Nov 15, 2023 10:36 AM Nathanel DEL wrote: Reason for CRM: Called 11/15/2023 to sched AWV - NO VOICEMAIL  Nathanel Paschal; Care Guide Ambulatory Clinical Support [place] [facility] Direct Dial: [phone]

## 2023-11-30 ENCOUNTER — Telehealth: Payer: Self-pay | Admitting: Internal Medicine

## 2023-11-30 NOTE — Telephone Encounter (Signed)
 Copied from CRM #[id number]. Topic: Medicare AWV >> Nov 30, 2023  1:55 PM Nathanel DEL wrote: Reason for CRM: Called 11/30/2023 to sched AWV - Hangs up  Nathanel Paschal; Care Guide Ambulatory Clinical Support [place] [facility] Direct Dial: [phone]

## 2023-12-03 ENCOUNTER — Ambulatory Visit: Payer: Self-pay

## 2023-12-03 NOTE — Telephone Encounter (Signed)
 FYI Only or Action Required?: FYI only for provider.  Patient was last seen in primary care on 09/21/2023 by Shari Aloysius BRAVO, MD.  Called Nurse Triage reporting Constipation.Pt stated she did digital exam and no stool in rectal vault. No results from enema.   Symptoms began 5 days ago.  Interventions attempted: Other: enema.  Symptoms are: unchanged.  Triage Disposition: See Physician Within 24 Hours  Patient/caregiver understands and will follow disposition?: No, refuses disposition          Copied from CRM #[id number]. Topic: Clinical - Red Word Triage >> Dec 03, 2023  2:44 PM Martinique E wrote: [facility] that prompted transfer to Nurse Triage: Patient has been constipated and has not had a BM in 5 days, leading to mild abdomen pain. Also having some body aches. Reason for Disposition  Last bowel movement (BM) > 4 days ago  Answer Assessment - Initial Assessment Questions 1. STOOL PATTERN OR FREQUENCY: How often do you have a bowel movement (BM)?  (Normal range: 3 times a day to every 3 days)  When was your last BM?       Every 2 days  2. STRAINING: Do you have to strain to have a BM?      *No Answer* 3. ONSET: When did the constipation begin?     5 days  4. RECTAL PAIN: Does your rectum hurt when the stool comes out? If Yes, ask: Do you have hemorrhoids? How bad is the pain?  (Scale 1-10; or mild, moderate, severe)     no 5. BM COMPOSITION: Are the stools hard?      none 6. BLOOD ON STOOLS: Has there been any blood on the toilet tissue or on the surface of the BM? If Yes, ask: When was the last time?     Red blood on enema for end of enema 7. CHRONIC CONSTIPATION: Is this a new problem for you?  If No, ask: How long have you had this problem? (days, weeks, months)      No//5 days  8. CHANGES IN DIET OR HYDRATION: Have there been any recent changes in your diet? How much fluids are you drinking on a daily basis?  How much have you had to drink  today?     *No Answer* 9. MEDICINES: Have you been taking any new medicines? Are you taking any narcotic pain medicines? (e.g., Dilaudid, morphine, Percocet, Vicodin)     *No Answer* 10. LAXATIVES: Have you been using any stool softeners, laxatives, or enemas?  If Yes, ask What are you using, how often, and when was the last time?       Tried enema didnt work - Energy manager removal  11. ACTIVITY:  How much walking do you do every day?  Has your activity level decreased in the past week?        *No Answer* 12. CAUSE: What do you think is causing the constipation?        *No Answer* 13. MEDICAL HISTORY: Do you have a history of hemorrhoids, rectal fissures, rectal surgery, or rectal abscess?         *No Answer* 14. OTHER SYMPTOMS: Do you have any other symptoms? (e.g., abdomen pain, bloating, fever, vomiting)    Very mild bloated  Protocols used: Constipation-A-AH

## 2023-12-03 NOTE — Telephone Encounter (Signed)
 Message not clear. Disposition to be seen in 24 hours- however message doesn't state if she is going to ED or UC or if Pt declined appt. I sent a E2C2 message to supervisors.

## 2023-12-03 NOTE — Telephone Encounter (Signed)
 Spoke w/ Pt- informed we received message, she has tried enema and miralax, still no results. Informed she should be seen at [facility] or ED. Pt verbalized understanding, states she doesn't drive and will have her daughter take her tomorrow.

## 2023-12-08 DIAGNOSIS — K625 Hemorrhage of anus and rectum: Secondary | ICD-10-CM | POA: Diagnosis not present

## 2023-12-08 DIAGNOSIS — Z9049 Acquired absence of other specified parts of digestive tract: Secondary | ICD-10-CM | POA: Diagnosis not present

## 2023-12-08 DIAGNOSIS — K5732 Diverticulitis of large intestine without perforation or abscess without bleeding: Secondary | ICD-10-CM | POA: Diagnosis not present

## 2023-12-08 DIAGNOSIS — K5900 Constipation, unspecified: Secondary | ICD-10-CM | POA: Diagnosis not present

## 2023-12-08 DIAGNOSIS — J9811 Atelectasis: Secondary | ICD-10-CM | POA: Diagnosis not present

## 2023-12-08 DIAGNOSIS — R509 Fever, unspecified: Secondary | ICD-10-CM | POA: Diagnosis not present

## 2023-12-08 DIAGNOSIS — I517 Cardiomegaly: Secondary | ICD-10-CM | POA: Diagnosis not present

## 2023-12-24 ENCOUNTER — Ambulatory Visit (INDEPENDENT_AMBULATORY_CARE_PROVIDER_SITE_OTHER): Admitting: Internal Medicine

## 2023-12-24 ENCOUNTER — Encounter: Payer: Self-pay | Admitting: Internal Medicine

## 2023-12-24 VITALS — BP 122/74 | HR 59 | Temp 97.9°F | Resp 16 | Ht 64.0 in | Wt 176.1 lb

## 2023-12-24 DIAGNOSIS — K5792 Diverticulitis of intestine, part unspecified, without perforation or abscess without bleeding: Secondary | ICD-10-CM | POA: Diagnosis not present

## 2023-12-24 NOTE — Progress Notes (Signed)
 Subjective:    Patient ID: Shari Drake, female    DOB: 1945/08/21, 78 y.o.   MRN: [id number]  DOS:  12/24/2023 Type of visit - description: ER follow-up  Outside ER visit  12/08/23 Presented with constipation, flulike symptoms.  On exam she was slightly tender in the left lower quadrant. WBCs 12, hemoglobin 13.2, platelets 425 to slightly high. Creatinine 0.8.  LFTs normal.  CT abdomen: Moderate proximal sigmoid colon acute diverticulitis with mild left pelvic sidewall fluid, without drainable abscess or pneumoperitoneum. ED MD talked w/ surgery, they recommend treatment with p.o. antibiotics.  Rx Augmentin.   Since the ER visit, she took antibiotics and is feeling much better. No fever or chills. No nausea or vomiting. Bowel movements are regular, normal in appearance, appetite okay.  Review of Systems See above   Past Medical History:  Diagnosis Date   Anxiety    Breast cancer (HCC) 11/2010   Chronic kidney disease    Depression    Diabetes (HCC)    History of chicken pox    History of kidney stones    Hyperlipidemia    Hypertension    Hypothyroidism    Insomnia    Migraine    Polio    at 33 months old   Rosacea    Thyroid  cancer (HCC) 2013    Past Surgical History:  Procedure Laterality Date   APPENDECTOMY     BREAST SURGERY     masectomy- L and R   CESAREAN SECTION     1970, 1973   CHOLECYSTECTOMY  09/30/1995   EXTRACORPOREAL SHOCK WAVE LITHOTRIPSY  08/27/2017   TOTAL THYROIDECTOMY  05/05/2011   TUBAL LIGATION      Current Outpatient Medications  Medication Instructions   aspirin-acetaminophen-caffeine (EXCEDRIN MIGRAINE) 250-250-65 MG tablet Every 6 hours PRN   atorvastatin  (LIPITOR) 10 mg, Oral, Daily at bedtime   cholecalciferol (VITAMIN D3) 2,000 Units, Daily   escitalopram  (LEXAPRO ) 20 mg, Oral, Daily   glucose blood (ONETOUCH VERIO) test strip USE TO CHECK BLOOD SUGARS UP TO 3 TIMES DAILY   Lancets (ONETOUCH DELICA PLUS LANCET33G) MISC USE  TO CHECK BLOOD SUGARS UP TO THREE TIMES DAILY   levothyroxine  (SYNTHROID ) 125 mcg, Oral, Daily before breakfast   losartan  (COZAAR ) 50 mg, Oral, Daily   Melatonin 200 mcg, Daily   metFORMIN  (GLUCOPHAGE -XR) 500 mg, Oral, 2 times daily with meals   metoprolol  tartrate (LOPRESSOR ) 25 mg, Oral, 2 times daily       Objective:   Physical Exam BP 122/74   Pulse (!) 59   Temp 97.9 F (36.6 C) (Oral)   Resp 16   Ht 5' 4 (1.626 m)   Wt 176 lb 2 oz (79.9 kg)   SpO2 97%   BMI 30.23 kg/m  General:   Well developed, NAD, BMI noted. HEENT:  Normocephalic . Face symmetric, atraumatic Lungs:  CTA B Normal respiratory effort, no intercostal retractions, no accessory muscle use. Heart: RRR,  no murmur.  Lower extremities: no pretibial edema bilaterally  Skin: Not pale. Not jaundice Neurologic:  alert & oriented X3.  Speech normal, gait appropriate for age and unassisted Psych--  Cognition and judgment appear intact.  Cooperative with normal attention span and concentration.  Behavior appropriate. No anxious or depressed appearing.      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  Discussed with patient what diverticulitis is and the fact that she may have another episode. CT report did not show any red flags other than acute diverticulitis. She is much better after a round of Augmentin. We talked about possibly referred to GI for a colonoscopy but she is not convinced is what she likes to do Plan: Come back as scheduled in December, sooner if she has any GI symptoms.

## 2023-12-24 NOTE — Patient Instructions (Signed)
 See you in December  Call sooner if needed

## 2023-12-25 NOTE — Assessment & Plan Note (Signed)
 Diverticulitis: Pt  is 78 years old, first episode of diverticulitis, no history of previous colonoscopies.  Discussed with patient what diverticulitis is and the fact that she may have another episode. CT report did not show any red flags other than acute diverticulitis. She is much better after a round of Augmentin. We talked about possibly referred to GI for a colonoscopy but she is not convinced is what she likes to do Plan: Come back as scheduled in December, sooner if she has any GI symptoms.

## 2023-12-30 ENCOUNTER — Other Ambulatory Visit: Payer: Self-pay | Admitting: Internal Medicine

## 2023-12-30 ENCOUNTER — Encounter: Payer: Self-pay | Admitting: Internal Medicine

## 2023-12-31 ENCOUNTER — Other Ambulatory Visit: Payer: Self-pay | Admitting: Internal Medicine

## 2024-02-15 ENCOUNTER — Encounter: Payer: Self-pay | Admitting: Pharmacist

## 2024-02-15 NOTE — Progress Notes (Signed)
 Pharmacy Quality Measure Review  This patient is appearing on a report for being at risk of failing the adherence measure for cholesterol (statin), diabetes, and hypertension (ACEi/ARB) medications this calendar year.   Medication: metformin   Last fill date: 11/03/2023 for 90 day supply  Reviewed recent refill history in Dr Annemarie database. Actual last refill date was 01/21/2024 for 90 day supply. Patient has no refills remaining. Next appointment with PCP is 04/14/2024.    Medication: atorvastatin   Last fill date: 12/31/2023 for 90 day supply - has 1 refill remaining  Medication: losartan  50mg  Last fill date: 02/06/2024 for 90 day supply - no refills remaining  Insurance report was not up to date. No action needed at this time.   Madelin Ray, PharmD Clinical Pharmacist [facility]  Population Health [phone]

## 2024-02-23 ENCOUNTER — Telehealth: Payer: Self-pay | Admitting: Internal Medicine

## 2024-02-23 MED ORDER — BLOOD GLUCOSE TEST VI STRP: ORAL_STRIP | 12 refills | Status: AC

## 2024-02-23 MED ORDER — BLOOD GLUCOSE MONITORING SUPPL DEVI: 1.0000 | 0 refills | Status: AC

## 2024-02-23 MED ORDER — LANCETS MISC: 12 refills | Status: AC

## 2024-02-23 NOTE — Telephone Encounter (Signed)
 Copied from CRM [id number]. Topic: Clinical - Prescription Issue >> Feb 23, 2024 11:07 AM Alfonso HERO wrote: Reason for RMF:[id number] called stating the patients insurance no longer covers the glucose blood (ONETOUCH VERIO) test strip Lancets (ONETOUCH DELICA PLUS LANCET33G) MISC  A new script needs to be sent over for Accucheck lancets, meter and strips.

## 2024-02-23 NOTE — Telephone Encounter (Signed)
 Rx for Accu-chek meter and supplies sent to pharmacy.

## 2024-04-14 ENCOUNTER — Encounter: Admitting: Internal Medicine

## 2024-05-02 ENCOUNTER — Other Ambulatory Visit: Payer: Self-pay | Admitting: Internal Medicine

## 2024-05-10 ENCOUNTER — Other Ambulatory Visit: Payer: Self-pay | Admitting: Internal Medicine

## 2024-06-23 ENCOUNTER — Encounter: Admitting: Internal Medicine

## 2024-08-11 ENCOUNTER — Encounter: Admitting: Internal Medicine
